# Patient Record
Sex: Female | Born: 1970 | Race: Black or African American | Hispanic: No | Marital: Married | Smoking: Never smoker
Health system: Southern US, Community
[De-identification: ages and names within clinical notes are randomized; demographics above are authoritative.]

## PROBLEM LIST (undated history)

## (undated) DIAGNOSIS — R011 Cardiac murmur, unspecified: Secondary | ICD-10-CM

## (undated) DIAGNOSIS — D649 Anemia, unspecified: Secondary | ICD-10-CM

## (undated) DIAGNOSIS — Z8489 Family history of other specified conditions: Secondary | ICD-10-CM

## (undated) DIAGNOSIS — N751 Abscess of Bartholin's gland: Secondary | ICD-10-CM

## (undated) DIAGNOSIS — N900 Mild vulvar dysplasia: Secondary | ICD-10-CM

## (undated) DIAGNOSIS — Z9889 Other specified postprocedural states: Secondary | ICD-10-CM

## (undated) DIAGNOSIS — B009 Herpesviral infection, unspecified: Secondary | ICD-10-CM

## (undated) DIAGNOSIS — A630 Anogenital (venereal) warts: Secondary | ICD-10-CM

## (undated) HISTORY — DX: Anogenital (venereal) warts: A63.0

## (undated) HISTORY — DX: Cardiac murmur, unspecified: R01.1

## (undated) HISTORY — PX: VULVA /PERINEUM BIOPSY: SHX319

## (undated) HISTORY — PX: EYE SURGERY: SHX253

## (undated) HISTORY — DX: Abscess of Bartholin's gland: N75.1

## (undated) HISTORY — DX: Herpesviral infection, unspecified: B00.9

## (undated) HISTORY — DX: Mild vulvar dysplasia: N90.0

## (undated) HISTORY — PX: WISDOM TOOTH EXTRACTION: SHX21

---

## 1998-11-20 ENCOUNTER — Inpatient Hospital Stay (HOSPITAL_COMMUNITY): Admission: AD | Admit: 1998-11-20 | Discharge: 1998-11-22 | Payer: Self-pay | Admitting: Obstetrics and Gynecology

## 1998-11-24 ENCOUNTER — Encounter (HOSPITAL_COMMUNITY): Admission: RE | Admit: 1998-11-24 | Discharge: 1999-02-22 | Payer: Self-pay | Admitting: Obstetrics and Gynecology

## 1999-06-29 ENCOUNTER — Other Ambulatory Visit: Admission: RE | Admit: 1999-06-29 | Discharge: 1999-06-29 | Payer: Self-pay | Admitting: Obstetrics and Gynecology

## 2000-06-01 ENCOUNTER — Emergency Department (HOSPITAL_COMMUNITY): Admission: EM | Admit: 2000-06-01 | Discharge: 2000-06-01 | Payer: Self-pay | Admitting: Emergency Medicine

## 2000-06-28 ENCOUNTER — Other Ambulatory Visit: Admission: RE | Admit: 2000-06-28 | Discharge: 2000-06-28 | Payer: Self-pay | Admitting: Obstetrics and Gynecology

## 2001-05-02 ENCOUNTER — Inpatient Hospital Stay (HOSPITAL_COMMUNITY): Admission: AD | Admit: 2001-05-02 | Discharge: 2001-05-04 | Payer: Self-pay | Admitting: Obstetrics and Gynecology

## 2001-07-26 ENCOUNTER — Other Ambulatory Visit: Admission: RE | Admit: 2001-07-26 | Discharge: 2001-07-26 | Payer: Self-pay | Admitting: Obstetrics and Gynecology

## 2002-07-15 ENCOUNTER — Other Ambulatory Visit: Admission: RE | Admit: 2002-07-15 | Discharge: 2002-07-15 | Payer: Self-pay | Admitting: Obstetrics and Gynecology

## 2003-07-25 ENCOUNTER — Other Ambulatory Visit: Admission: RE | Admit: 2003-07-25 | Discharge: 2003-07-25 | Payer: Self-pay | Admitting: Obstetrics and Gynecology

## 2004-07-29 ENCOUNTER — Other Ambulatory Visit: Admission: RE | Admit: 2004-07-29 | Discharge: 2004-07-29 | Payer: Self-pay | Admitting: Obstetrics and Gynecology

## 2005-07-25 ENCOUNTER — Other Ambulatory Visit: Admission: RE | Admit: 2005-07-25 | Discharge: 2005-07-25 | Payer: Self-pay | Admitting: Obstetrics and Gynecology

## 2006-07-26 ENCOUNTER — Other Ambulatory Visit: Admission: RE | Admit: 2006-07-26 | Discharge: 2006-07-26 | Payer: Self-pay | Admitting: Obstetrics and Gynecology

## 2009-07-13 ENCOUNTER — Encounter: Admission: RE | Admit: 2009-07-13 | Discharge: 2009-09-08 | Payer: Self-pay | Admitting: Nurse Practitioner

## 2009-08-20 ENCOUNTER — Ambulatory Visit (HOSPITAL_COMMUNITY): Admission: RE | Admit: 2009-08-20 | Discharge: 2009-08-20 | Payer: Self-pay | Admitting: Obstetrics and Gynecology

## 2010-08-24 ENCOUNTER — Ambulatory Visit (HOSPITAL_COMMUNITY): Admission: RE | Admit: 2010-08-24 | Discharge: 2010-08-24 | Payer: Self-pay | Admitting: Obstetrics and Gynecology

## 2011-03-04 NOTE — H&P (Signed)
[facility]  Patient:    CHACE, BISCH                      MRN: [id number] Adm. Date:  05/02/01 Attending:  Janine Limbo, M.D. Dictator:   Mack Guise, C.N.M.                         History and Physical  HISTORY:                      Ms. Culbertson is a gravida 3, para 1-0-1-1, at term, EDD May 02, 2001 as determined by LMP date and confirmed with pregnancy ultrasonography. She presents in advanced active labor, reports positive fetal movement, no bleeding, no rupture of membranes, denies any headache or visual changes or epigastric pain. 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 Her pregnancy was initially evaluated at the office of [facility] on September 13, 2000 at approximately [redacted] weeks gestation. Her pregnancy has been essentially unremarkable being size equal to dates throughout, normotensive with no proteinuria.  OBSTETRIC HISTORY:            1995, elective AB. In the year 2000, normal spontaneous vaginal delivery with the birth of a 7-pound 14-ounce female infant at term named Marvis Moeller with no complication, and the present pregnancy.  PAST MEDICAL HISTORY:         History of HSV, last outbreak 1998.  PAST SURGICAL HISTORY:        Wisdom teeth.  FAMILY HISTORY:               Maternal grandfather MI, maternal aunt MI, patients mother with a history of varicose veins, maternal uncle with diabetes, and patients father with diabetes. A maternal uncle has cancer of unknown origin.  GENETIC HISTORY:              There is no family history of familial or genetic disorders, children that died in infancy, or that were born with birth defects.  ALLERGIES:                    No known drug allergies.  HABITS:                       Patient denies the use of tobacco, alcohol, or illicit  drugs.  SOCIAL HISTORY:               Ms. Mcgath is a 40 year old African-American, married female. Her husband, Richetta Cubillos, is involved and supportive. They are of the [facility] faith.  REVIEW OF SYSTEMS:            There are no signs or symptoms suggestive of focal or systemic disease and this patient is typical of one with a uterine pregnancy at term in advanced active labor.  PHYSICAL EXAMINATION:  VITAL SIGNS:                  Stable, afebrile.  HEENT:                        Unremarkable.  HEART:  Regular rate and rhythm.  LUNGS:                        Clear.  ABDOMEN:                      Gravid in its contour. Uterine fundus is noted to extend 40 cm above the level of the pubis symphysis. Leopolds maneuvers finds the infant to be a longitudinal lie, cephalic presentation, and estimated fetal weight of 7-1/2 pounds.  CERVIX:                       Digital exam of the cervix finds it to be completely dilated, completely effaced with a vertex at a 0 station, membranes intact with rupture of membranes with exam for clear fluid. Sterile speculum exam finds no external HSV lesions and no internal HSV lesions noted.  ELECTRONIC FETAL MONITOR:     The baseline of the electronic fetal monitor is 130-140 with average long-term variability. It is reactive and reassuring.  EXTREMITIES:                  There is no pathologic edema and DTRs are 1+ with no clonus.  ASSESSMENT:                   1. Intrauterine pregnancy at term.                               2. Advanced active labor.  PLAN:                         Admit per Dr. Marline Backbone. Follow expectantly in anticipation of a spontaneous vaginal delivery. DD:  05/02/01 TD:  05/02/01 Job: [id number]

## 2011-07-19 ENCOUNTER — Other Ambulatory Visit (HOSPITAL_COMMUNITY): Payer: Self-pay | Admitting: Obstetrics and Gynecology

## 2011-07-19 DIAGNOSIS — Z1231 Encounter for screening mammogram for malignant neoplasm of breast: Secondary | ICD-10-CM

## 2011-08-31 ENCOUNTER — Ambulatory Visit (HOSPITAL_COMMUNITY): Payer: Self-pay

## 2011-09-29 ENCOUNTER — Ambulatory Visit (HOSPITAL_COMMUNITY): Payer: Self-pay

## 2011-11-02 ENCOUNTER — Ambulatory Visit (HOSPITAL_COMMUNITY)
Admission: RE | Admit: 2011-11-02 | Discharge: 2011-11-02 | Disposition: A | Discharge status: Home / Self Care | Payer: 59 | Source: Ambulatory Visit | Attending: Obstetrics and Gynecology | Admitting: Obstetrics and Gynecology

## 2011-11-02 DIAGNOSIS — Z1231 Encounter for screening mammogram for malignant neoplasm of breast: Secondary | ICD-10-CM

## 2012-08-21 ENCOUNTER — Other Ambulatory Visit: Payer: Self-pay | Admitting: Obstetrics and Gynecology

## 2012-08-21 DIAGNOSIS — Z1231 Encounter for screening mammogram for malignant neoplasm of breast: Secondary | ICD-10-CM

## 2012-09-24 ENCOUNTER — Ambulatory Visit: Payer: Self-pay | Admitting: Obstetrics and Gynecology

## 2012-10-30 ENCOUNTER — Encounter: Payer: Self-pay | Admitting: Obstetrics and Gynecology

## 2012-10-30 ENCOUNTER — Ambulatory Visit: Payer: 59 | Admitting: Obstetrics and Gynecology

## 2012-10-30 VITALS — BP 122/80 | Ht 67.5 in | Wt 181.0 lb

## 2012-10-30 DIAGNOSIS — Z01419 Encounter for gynecological examination (general) (routine) without abnormal findings: Secondary | ICD-10-CM

## 2012-10-30 DIAGNOSIS — Z124 Encounter for screening for malignant neoplasm of cervix: Secondary | ICD-10-CM

## 2012-10-30 NOTE — Progress Notes (Signed)
Subjective:    Kathryn Sandoval is a 42 y.o. female, G3P0, who presents for an annual exam. The patient reports heavy menses for the past six months but not every month. Flow amount is as described below.  Though, per patient her iron level was 11 she has not felt more tired or had any dizziness.     Menstrual cycle:   LMP: Patient's last menstrual period was 10/07/2012.  Flow 4 days with pad with tampon change every hour but some months not as heavy; denies any cramping or intermenstrual bleeding            Review of Systems Pertinent items are noted in HPI. Denies pelvic pain, urinary tract symptoms, vaginitis symptoms, irregular bleeding, menopausal symptoms, change in bowel habits or rectal bleeding   Objective:    Ht 5' 7.5" (1.715 m)  Wt 181 lb (82.101 kg)  BMI 27.93 kg/m2  LMP 10/07/2012     Wt Readings from Last 1 Encounters:  10/30/12 181 lb (82.101 kg)   Body mass index is 27.93 kg/(m^2). General Appearance: Alert, no acute distress HEENT: Grossly normal Neck / Thyroid: Supple, no thyromegaly or cervical adenopathy Lungs: Clear to auscultation bilaterally Back: No CVA tenderness Breast Exam: No masses or nodes.No dimpling, nipple retraction or discharge. Cardiovascular: Regular rate and rhythm.  Gastrointestinal: Soft, non-tender, no masses or organomegaly Pelvic Exam: EGBUS-wnl, vagina-normal rugae, cervix- without lesions or tenderness, uterus appears normal size shape and consistency, adnexae-no masses or tenderness Rectovaginal: no masses and normal sphincter tone Lymphatic Exam: Non-palpable nodes in neck, clavicular,  axillary, or inguinal regions  Skin: no rashes or abnormalities Extremities: no clubbing cyanosis or edema  Neurologic: grossly normal Psychiatric: Alert and oriented    Assessment:   Routine GYN Exam Menorrhagia   Plan:  Reviewed management options for menorrhagia-both medical and surgical -patient declines for now  PAP sent  RTO 1 year or  prn  Kathryn Sandoval,ELMIRAPA-C

## 2012-10-30 NOTE — Progress Notes (Signed)
Regular Periods: yes Mammogram: yes  Monthly Breast Ex.: yes Exercise: yes  Tetanus < 10 years: yes Seatbelts: yes  NI. Bladder Functn.: yes Abuse at home: no  Daily BM's: yes Stressful Work: yes  Healthy Diet: yes Sigmoid-Colonoscopy: no  Calcium: yes Medical problems this year: cycle are heavy   LAST PAP:2013  Contraception: vas  spouse  Mammogram:  2013 scheduled Friday   PCP: DR. LISA MILLER  PMH: NO CHANGE  FMH: NO CHANGE  Last Bone Scan: NO  PT IS MARRIED.

## 2012-11-01 LAB — PAP IG W/ RFLX HPV ASCU

## 2012-11-02 ENCOUNTER — Ambulatory Visit (HOSPITAL_COMMUNITY)
Admission: RE | Admit: 2012-11-02 | Discharge: 2012-11-02 | Disposition: A | Discharge status: Home / Self Care | Payer: 59 | Source: Ambulatory Visit | Attending: Obstetrics and Gynecology | Admitting: Obstetrics and Gynecology

## 2012-11-02 DIAGNOSIS — Z1231 Encounter for screening mammogram for malignant neoplasm of breast: Secondary | ICD-10-CM

## 2012-11-06 ENCOUNTER — Encounter: Payer: Self-pay | Admitting: Obstetrics and Gynecology

## 2012-11-28 ENCOUNTER — Telehealth: Payer: Self-pay | Admitting: Obstetrics and Gynecology

## 2012-11-28 MED ORDER — FLUCONAZOLE 150 MG PO TABS: 150.0000 mg | ORAL_TABLET | Freq: Once | ORAL | Status: DC

## 2012-11-28 NOTE — Telephone Encounter (Signed)
Tc from pt. Sates has creamy white D/C x 2 wks. Tried [place] zole but  Sx returned. Per EP, informed yeast on pap.TC to pt. Informed Rx will be called. Instructions given.

## 2013-02-19 ENCOUNTER — Other Ambulatory Visit: Payer: Self-pay | Admitting: Obstetrics and Gynecology

## 2013-02-22 ENCOUNTER — Encounter (HOSPITAL_COMMUNITY): Payer: Self-pay

## 2013-03-11 NOTE — H&P (Signed)
Pt is presenting for removal of prolapsed fibroid and endometrial ablation.  The pt presented with a long history  of menorrhagia.  She has had anemia as low as 7.0. Her menses is is every month and lasts 4 days.  She changes a pad and or tampon q hour on the first two days.  It has been heavy for a year  NKDA Scheduled Meds:iron silfate, multivitamin Continuous Infusions: PRN Meds:.   Past Medical History  Diagnosis Date  . Condyloma     H/O  . VIN I (vulvar intraepithelial neoplasia I)   . Bartholin's gland abscess     RIGHT  . HSV (herpes simplex virus) infection     LAST OUTBREAK 1998   Past Surgical History  Procedure Laterality Date  . Vulva /perineum biopsy    . Wisdom tooth extraction     History   Social History  . Marital Status: Married    Spouse Name: N/A    Number of Children: N/A  . Years of Education: N/A   Occupational History  . Not on file.   Social History Main Topics  . Smoking status: Never Smoker   . Smokeless tobacco: Never Used  . Alcohol Use: Yes  . Drug Use: No  . Sexually Active: Yes    Birth Control/ Protection: Surgical     Comment: spouse had vas   Other Topics Concern  . Not on file   Social History Narrative  . No narrative on file    Family History  Problem Relation Age of Onset  . Hypertension Mother   . Diabetes Father   . Hypertension Sister   . Hypertension Sister    Ht 5\' 7"  (1.702 m)  Wt 176 lb (79.833 kg)  BMI 27.56 kg/m2 Physical Examination: General appearance - alert, well appearing, and in no distress Mental status - alert, oriented to person, place, and time Chest - clear to auscultation, no wheezes, rales or rhonchi, symmetric air entry Heart - normal rate, regular rhythm, normal S1, S2, no murmurs, rubs, clicks or gallops Abdomen - soft, nontender, nondistended, no masses or organomegaly Pelvic - VULVA: normal appearing vulva with no masses, tenderness or lesions, VAGINA: normal appearing vagina with normal  color and discharge, no lesions, CERVIX: lesion present prolapsing fibroid about 4 cm through the cervix, UTERUS: uterus is normal size, shape, consistency and nontender, ADNEXA: normal adnexa in size, nontender and no masses, PAP: 1/14 WNL Neurological - alert, oriented, normal speech, no focal findings or movement disorder noted Extremities - Homan's sign negative bilaterally US 6by 6 cm with normal ovaries Bilaterally Menorrhagia Prolapsing fibroid Plan D&C Hysteroscopy and removal of prolapsing fibroid Pt understands the risks to be but not limited to bleeding, infection damage to the uterus and internal organs.  Pt voiced understanding of the benefits and risks and agrees to proceed.

## 2013-03-12 ENCOUNTER — Ambulatory Visit (HOSPITAL_COMMUNITY)
Admission: RE | Admit: 2013-03-12 | Discharge: 2013-03-12 | Disposition: A | Discharge status: Home / Self Care | Payer: 59 | Source: Ambulatory Visit | Attending: Obstetrics and Gynecology | Admitting: Obstetrics and Gynecology

## 2013-03-12 ENCOUNTER — Encounter (HOSPITAL_COMMUNITY)
Admission: RE | Disposition: A | Discharge status: Home / Self Care | Payer: Self-pay | Source: Ambulatory Visit | Attending: Obstetrics and Gynecology

## 2013-03-12 ENCOUNTER — Ambulatory Visit (HOSPITAL_COMMUNITY): Payer: 59 | Admitting: Anesthesiology

## 2013-03-12 ENCOUNTER — Encounter (HOSPITAL_COMMUNITY): Payer: Self-pay | Admitting: Anesthesiology

## 2013-03-12 ENCOUNTER — Encounter (HOSPITAL_COMMUNITY): Payer: Self-pay | Admitting: *Deleted

## 2013-03-12 DIAGNOSIS — D259 Leiomyoma of uterus, unspecified: Secondary | ICD-10-CM | POA: Insufficient documentation

## 2013-03-12 DIAGNOSIS — N920 Excessive and frequent menstruation with regular cycle: Secondary | ICD-10-CM | POA: Insufficient documentation

## 2013-03-12 DIAGNOSIS — N841 Polyp of cervix uteri: Secondary | ICD-10-CM | POA: Insufficient documentation

## 2013-03-12 HISTORY — PX: DILITATION & CURRETTAGE/HYSTROSCOPY WITH THERMACHOICE ABLATION: SHX5569

## 2013-03-12 HISTORY — DX: Anemia, unspecified: D64.9

## 2013-03-12 LAB — CBC
HCT: 42.3 % (ref 36.0–46.0)
Hemoglobin: 13.6 g/dL (ref 12.0–15.0)
MCHC: 32.2 g/dL (ref 30.0–36.0)

## 2013-03-12 SURGERY — DILATATION & CURETTAGE/HYSTEROSCOPY WITH THERMACHOICE ABLATION
Anesthesia: General | Site: Vagina | Wound class: Clean Contaminated

## 2013-03-12 MED ORDER — KETOROLAC TROMETHAMINE 30 MG/ML IJ SOLN
INTRAMUSCULAR | Status: DC | PRN
  Administered 2013-03-12: 30 mg via INTRAVENOUS

## 2013-03-12 MED ORDER — PROPOFOL 10 MG/ML IV EMUL
INTRAVENOUS | Status: AC
Start: 2013-03-12 — End: 2013-03-12
  Filled 2013-03-12: qty 20

## 2013-03-12 MED ORDER — DEXTROSE 5 % IV SOLN
INTRAVENOUS | Status: DC | PRN
  Administered 2013-03-12: 1000 mL

## 2013-03-12 MED ORDER — HYDROCODONE-ACETAMINOPHEN 5-325 MG PO TABS: 1.0000 | ORAL_TABLET | Freq: Four times a day (QID) | ORAL | Status: DC | PRN

## 2013-03-12 MED ORDER — LACTATED RINGERS IV SOLN
INTRAVENOUS | Status: DC
  Administered 2013-03-12: 13:00:00 via INTRAVENOUS
  Administered 2013-03-12: 50 mL/h via INTRAVENOUS

## 2013-03-12 MED ORDER — LIDOCAINE HCL 2 % IJ SOLN
INTRAMUSCULAR | Status: DC | PRN
  Administered 2013-03-12: 20 mL

## 2013-03-12 MED ORDER — DEXAMETHASONE SODIUM PHOSPHATE 4 MG/ML IJ SOLN
INTRAMUSCULAR | Status: DC | PRN
  Administered 2013-03-12: 10 mg via INTRAVENOUS

## 2013-03-12 MED ORDER — FENTANYL CITRATE 0.05 MG/ML IJ SOLN
INTRAMUSCULAR | Status: DC | PRN
  Administered 2013-03-12 (×4): 50 ug via INTRAVENOUS

## 2013-03-12 MED ORDER — LIDOCAINE HCL (CARDIAC) 20 MG/ML IV SOLN
INTRAVENOUS | Status: AC
  Filled 2013-03-12: qty 5

## 2013-03-12 MED ORDER — MIDAZOLAM HCL 2 MG/2ML IJ SOLN
INTRAMUSCULAR | Status: AC
  Filled 2013-03-12: qty 2

## 2013-03-12 MED ORDER — PROPOFOL 10 MG/ML IV BOLUS
INTRAVENOUS | Status: DC | PRN
  Administered 2013-03-12: 180 mg via INTRAVENOUS

## 2013-03-12 MED ORDER — KETOROLAC TROMETHAMINE 30 MG/ML IJ SOLN
INTRAMUSCULAR | Status: AC
  Filled 2013-03-12: qty 1

## 2013-03-12 MED ORDER — LIDOCAINE HCL 2 % IJ SOLN
INTRAMUSCULAR | Status: AC
  Filled 2013-03-12: qty 20

## 2013-03-12 MED ORDER — MIDAZOLAM HCL 5 MG/5ML IJ SOLN
INTRAMUSCULAR | Status: DC | PRN
  Administered 2013-03-12: 2 mg via INTRAVENOUS

## 2013-03-12 MED ORDER — LIDOCAINE HCL (CARDIAC) 20 MG/ML IV SOLN
INTRAVENOUS | Status: DC | PRN
  Administered 2013-03-12: 40 mg via INTRAVENOUS
  Administered 2013-03-12: 30 mg via INTRAVENOUS

## 2013-03-12 MED ORDER — FENTANYL CITRATE 0.05 MG/ML IJ SOLN
INTRAMUSCULAR | Status: AC
  Filled 2013-03-12: qty 4

## 2013-03-12 MED ORDER — ONDANSETRON HCL 4 MG/2ML IJ SOLN
INTRAMUSCULAR | Status: DC | PRN
  Administered 2013-03-12: 4 mg via INTRAVENOUS

## 2013-03-12 MED ORDER — DEXAMETHASONE SODIUM PHOSPHATE 10 MG/ML IJ SOLN
INTRAMUSCULAR | Status: AC
  Filled 2013-03-12: qty 1

## 2013-03-12 MED ORDER — ONDANSETRON HCL 4 MG/2ML IJ SOLN
INTRAMUSCULAR | Status: AC
  Filled 2013-03-12: qty 2

## 2013-03-12 MED ORDER — HYDROCODONE-ACETAMINOPHEN 5-325 MG PO TABS
ORAL_TABLET | ORAL | Status: AC
  Filled 2013-03-12: qty 1

## 2013-03-12 SURGICAL SUPPLY — 14 items
CANISTER SUCTION 2500CC (MISCELLANEOUS) ×2 IMPLANT
CATH ROBINSON RED A/P 16FR (CATHETERS) ×2 IMPLANT
CATH THERMACHOICE III (CATHETERS) ×2 IMPLANT
CLOTH BEACON ORANGE TIMEOUT ST (SAFETY) ×2 IMPLANT
CONTAINER PREFILL 10% NBF 60ML (FORM) ×4 IMPLANT
DRESSING TELFA 8X3 (GAUZE/BANDAGES/DRESSINGS) ×2 IMPLANT
GLOVE BIO SURGEON STRL SZ 6.5 (GLOVE) ×2 IMPLANT
GOWN STRL REIN XL XLG (GOWN DISPOSABLE) ×4 IMPLANT
PACK HYSTEROSCOPY LF (CUSTOM PROCEDURE TRAY) ×2 IMPLANT
PAD OB MATERNITY 4.3X12.25 (PERSONAL CARE ITEMS) ×2 IMPLANT
PENCIL BUTTON HOLSTER BLD 10FT (ELECTRODE) ×2 IMPLANT
SYR 20CC LL (SYRINGE) ×2 IMPLANT
TOWEL OR 17X24 6PK STRL BLUE (TOWEL DISPOSABLE) ×4 IMPLANT
WATER STERILE IRR 1000ML POUR (IV SOLUTION) ×2 IMPLANT

## 2013-03-12 NOTE — Anesthesia Preprocedure Evaluation (Signed)

## 2013-03-12 NOTE — Op Note (Signed)
Preop Diagnosis: Prolapsing Fibroid, Menorrhagia CPT - 58563/58561   Postop Diagnosis: prolapsing fibroid, menorrhagia   Procedure: DILATATION & CURETTAGE/HYSTEROSCOPY WITH THERMACHOICE ABLATION  Removal of prolapsing endocervical mass  Anesthesia: General   Anesthesiologist: No responsible provider has been recorded for the case.   Attending: Michael Litter, MD   Assistant: none  Findings: prolapsing cervical mass c/w a fibroid.  Normal appearing endometrial cavity  Pathology: mass and endometrial curretings  Fluids:   UOP:  EBL: minimal  Complications: none  Procedure: The patient was taken to the operating room after risks benefits and alternatives were discussed with patient, the patient verbalized understanding and consent signed and witnessed. The patient was placed under general anesthesia and prepped and draped in normal sterile fashion. A bivalve speculum was placed in the patient's vagina and the anterior lip of the cervix was grasped with a single-tooth tenaculum. The mass protruding through the cervix was grasped with a polyp forcep and removed.  A small remainder of the stump in the endocervical canal was removed and cauterized wit the bovie.   The uterus sounded to 8 cm.  The hysteroscope was introduced into the uterine cavity with findings as noted above. A curettage was performed and currettings sent to pathology. The hysteroscope was reintroduced and hydrothermal ablation was performed without difficulty. The tenaculum and bivalve speculum were removed and there was good hemostasis at the tenaculum sites. Sponge lap and needle count was correct. The patient tolerated procedure well and was returned to the recovery room in good condition.

## 2013-03-12 NOTE — Transfer of Care (Signed)
Immediate Anesthesia Transfer of Care Note  Patient: Kathryn Sandoval  Procedure(s) Performed: Procedure(s) with comments: DILATATION & CURETTAGE/HYSTEROSCOPY WITH THERMACHOICE ABLATION (N/A) - D&C with Removal of Prolapsing Fibroid, Thermachoice EM Ablation  Patient Location: PACU  Anesthesia Type:General  Level of Consciousness: awake, alert  and oriented  Airway & Oxygen Therapy: Patient Spontanous Breathing and Patient connected to nasal cannula oxygen  Post-op Assessment: Report given to PACU RN and Post -op Vital signs reviewed and stable  Post vital signs: Reviewed and stable  Complications: No apparent anesthesia complications

## 2013-03-12 NOTE — Anesthesia Postprocedure Evaluation (Signed)
  Anesthesia Post-op Note  Anesthesia Post Note  Patient: Kathryn Sandoval  Procedure(s) Performed: Procedure(s) (LRB): DILATATION & CURETTAGE/HYSTEROSCOPY WITH THERMACHOICE ABLATION (N/A)  Anesthesia type: General  Patient location: PACU  Post pain: Pain level controlled  Post assessment: Post-op Vital signs reviewed  Last Vitals:  Filed Vitals:   03/12/13 1345  BP: 140/76  Pulse: 65  Temp:   Resp: 16    Post vital signs: Reviewed  Level of consciousness: sedated  Complications: No apparent anesthesia complications

## 2013-03-13 ENCOUNTER — Encounter (HOSPITAL_COMMUNITY): Payer: Self-pay | Admitting: Obstetrics and Gynecology

## 2013-06-09 ENCOUNTER — Ambulatory Visit (INDEPENDENT_AMBULATORY_CARE_PROVIDER_SITE_OTHER): Payer: 59 | Admitting: Emergency Medicine

## 2013-06-09 VITALS — BP 102/67 | HR 78 | Temp 98.3°F | Resp 18 | Wt 178.0 lb

## 2013-06-09 DIAGNOSIS — H8110 Benign paroxysmal vertigo, unspecified ear: Secondary | ICD-10-CM

## 2013-06-09 MED ORDER — MECLIZINE HCL 50 MG PO TABS: 50.0000 mg | ORAL_TABLET | Freq: Three times a day (TID) | ORAL | Status: DC | PRN

## 2013-06-09 NOTE — Patient Instructions (Addendum)

## 2013-06-09 NOTE — Progress Notes (Signed)
[facility] [address] [place] [address] [phone]  Date:  06/09/2013   Name:  Kathryn Sandoval   DOB:  13-Mar-1971   MRN:  [id number]  PCP:  Neldon Labella, MD    Chief Complaint: off balance   History of Present Illness:  Kathryn Sandoval is a 42 y.o. very pleasant female patient who presents with the following:  Ill with 'imbalance' and a wet feeling in both of her ears past few days. History of vertigo never treated.  No cough or coryza, fever or chills, nausea or vomiting. No ear pain or drainage.  No improvement with over the counter medications or other home remedies. Denies other complaint or health concern today.   There are no active problems to display for this patient.   Past Medical History  Diagnosis Date  . Condyloma     H/O  . VIN I (vulvar intraepithelial neoplasia I)   . Bartholin's gland abscess     RIGHT  . HSV (herpes simplex virus) infection     LAST OUTBREAK 1998  . Anemia   . Heart murmur     Past Surgical History  Procedure Laterality Date  . Vulva /perineum biopsy    . Wisdom tooth extraction    . Dilitation & currettage/hystroscopy with thermachoice ablation N/A 03/12/2013    Procedure: DILATATION & CURETTAGE/HYSTEROSCOPY WITH THERMACHOICE ABLATION;  Surgeon: Michael Litter, MD;  Location: [facility];  Service: Gynecology;  Laterality: N/A;  D&C with Removal of Prolapsing Fibroid, Thermachoice EM Ablation  . Eye surgery      History  Substance Use Topics  . Smoking status: Never Smoker   . Smokeless tobacco: Never Used  . Alcohol Use: Yes    Family History  Problem Relation Age of Onset  . Hypertension Mother   . Diabetes Father   . Hypertension Sister   . Hypertension Sister     No Known Allergies  Medication list has been reviewed and updated.  Current Outpatient Prescriptions on File Prior to Visit  Medication Sig Dispense Refill  . Ferrous Sulfate (IRON) 28 MG TABS Take by mouth.      .  fluocinonide ointment (LIDEX) 0.05 % Apply 1 application topically 3 (three) times a week. For dry scalp      . Multiple Vitamin (MULTIVITAMIN) tablet Take 1 tablet by mouth daily.      Marland Kitchen HYDROcodone-acetaminophen (NORCO/VICODIN) 5-325 MG per tablet Take 1 tablet by mouth every 6 (six) hours as needed for pain.  30 tablet  0   No current facility-administered medications on file prior to visit.    Review of Systems:  As per HPI, otherwise negative.    Physical Examination: Filed Vitals:   06/09/13 1417  BP: 102/67  Pulse: 78  Temp: 98.3 F (36.8 C)  Resp: 18   Filed Vitals:   06/09/13 1417  Weight: 178 lb (80.74 kg)   Body mass index is 27.87 kg/(m^2). Ideal Body Weight:    GEN: WDWN, NAD, Non-toxic, A & O x 3 HEENT: Atraumatic, Normocephalic. Neck supple. No masses, No LAD. Ears and Nose: No external deformity.  TM and external canal normal CV: RRR, No M/G/R. No JVD. No thrill. No extra heart sounds. PULM: CTA B, no wheezes, crackles, rhonchi. No retractions. No resp. distress. No accessory muscle use. ABD: S, NT, ND, +BS. No rebound. No HSM. EXTR: No c/c/e NEURO Normal gait. Tandem gait normal. PRRERLA EOMI CN 2-12 intact PSYCH: Normally interactive.  Conversant. Not depressed or anxious appearing.  Calm demeanor.    Assessment and Plan: Benign positional vertigo antivert    Signed,  Phillips Odor, MD

## 2013-09-16 ENCOUNTER — Other Ambulatory Visit: Payer: Self-pay | Admitting: Obstetrics and Gynecology

## 2013-09-16 DIAGNOSIS — Z1231 Encounter for screening mammogram for malignant neoplasm of breast: Secondary | ICD-10-CM

## 2013-11-04 ENCOUNTER — Ambulatory Visit (HOSPITAL_COMMUNITY)
Admission: RE | Admit: 2013-11-04 | Discharge: 2013-11-04 | Disposition: A | Discharge status: Home / Self Care | Payer: 59 | Source: Ambulatory Visit | Attending: Obstetrics and Gynecology | Admitting: Obstetrics and Gynecology

## 2013-11-04 DIAGNOSIS — Z1231 Encounter for screening mammogram for malignant neoplasm of breast: Secondary | ICD-10-CM

## 2014-09-29 ENCOUNTER — Other Ambulatory Visit (HOSPITAL_COMMUNITY): Payer: Self-pay | Admitting: Obstetrics and Gynecology

## 2014-09-29 DIAGNOSIS — Z1231 Encounter for screening mammogram for malignant neoplasm of breast: Secondary | ICD-10-CM

## 2014-11-06 ENCOUNTER — Ambulatory Visit (HOSPITAL_COMMUNITY)
Admission: RE | Admit: 2014-11-06 | Discharge: 2014-11-06 | Disposition: A | Discharge status: Home / Self Care | Payer: 59 | Source: Ambulatory Visit | Attending: Obstetrics and Gynecology | Admitting: Obstetrics and Gynecology

## 2014-11-06 DIAGNOSIS — Z1231 Encounter for screening mammogram for malignant neoplasm of breast: Secondary | ICD-10-CM | POA: Diagnosis present

## 2015-10-15 ENCOUNTER — Other Ambulatory Visit: Payer: Self-pay

## 2015-10-15 DIAGNOSIS — Z1231 Encounter for screening mammogram for malignant neoplasm of breast: Secondary | ICD-10-CM

## 2015-11-10 ENCOUNTER — Ambulatory Visit
Admission: RE | Admit: 2015-11-10 | Discharge: 2015-11-10 | Disposition: A | Discharge status: Home / Self Care | Payer: 59 | Source: Ambulatory Visit

## 2015-11-10 DIAGNOSIS — Z1231 Encounter for screening mammogram for malignant neoplasm of breast: Secondary | ICD-10-CM

## 2016-10-03 ENCOUNTER — Other Ambulatory Visit: Payer: Self-pay | Admitting: Obstetrics and Gynecology

## 2016-10-03 DIAGNOSIS — Z1231 Encounter for screening mammogram for malignant neoplasm of breast: Secondary | ICD-10-CM

## 2016-10-18 DIAGNOSIS — Z0000 Encounter for general adult medical examination without abnormal findings: Secondary | ICD-10-CM | POA: Diagnosis not present

## 2016-10-18 DIAGNOSIS — Z862 Personal history of diseases of the blood and blood-forming organs and certain disorders involving the immune mechanism: Secondary | ICD-10-CM | POA: Diagnosis not present

## 2016-10-24 DIAGNOSIS — Z0000 Encounter for general adult medical examination without abnormal findings: Secondary | ICD-10-CM | POA: Diagnosis not present

## 2016-11-10 ENCOUNTER — Ambulatory Visit
Admission: RE | Admit: 2016-11-10 | Discharge: 2016-11-10 | Disposition: A | Discharge status: Home / Self Care | Payer: 59 | Source: Ambulatory Visit | Attending: Obstetrics and Gynecology | Admitting: Obstetrics and Gynecology

## 2016-11-10 DIAGNOSIS — Z1231 Encounter for screening mammogram for malignant neoplasm of breast: Secondary | ICD-10-CM

## 2016-12-01 DIAGNOSIS — L219 Seborrheic dermatitis, unspecified: Secondary | ICD-10-CM | POA: Diagnosis not present

## 2016-12-12 ENCOUNTER — Ambulatory Visit (INDEPENDENT_AMBULATORY_CARE_PROVIDER_SITE_OTHER): Payer: Self-pay | Admitting: Allergy

## 2016-12-12 ENCOUNTER — Encounter (INDEPENDENT_AMBULATORY_CARE_PROVIDER_SITE_OTHER): Payer: Self-pay

## 2016-12-12 ENCOUNTER — Encounter: Payer: Self-pay | Admitting: Allergy

## 2016-12-12 VITALS — BP 112/68 | HR 68 | Temp 98.7°F | Resp 16 | Ht 66.5 in | Wt 180.6 lb

## 2016-12-12 DIAGNOSIS — J45990 Exercise induced bronchospasm: Secondary | ICD-10-CM

## 2016-12-12 DIAGNOSIS — R05 Cough: Secondary | ICD-10-CM | POA: Diagnosis not present

## 2016-12-12 DIAGNOSIS — R059 Cough, unspecified: Secondary | ICD-10-CM

## 2016-12-12 DIAGNOSIS — J302 Other seasonal allergic rhinitis: Secondary | ICD-10-CM | POA: Diagnosis not present

## 2016-12-12 MED ORDER — LEVOCETIRIZINE DIHYDROCHLORIDE 5 MG PO TABS: 5.0000 mg | ORAL_TABLET | Freq: Every evening | ORAL | 5 refills | Status: DC

## 2016-12-12 MED ORDER — ALBUTEROL SULFATE HFA 108 (90 BASE) MCG/ACT IN AERS: 2.0000 | INHALATION_SPRAY | RESPIRATORY_TRACT | 1 refills | Status: AC | PRN

## 2016-12-12 MED ORDER — AZELASTINE HCL 0.1 % NA SOLN
2.0000 | Freq: Two times a day (BID) | NASAL | 3 refills | Status: AC
Start: 2016-12-12 — End: ?

## 2016-12-12 NOTE — Patient Instructions (Addendum)
Seasonal Cough    - allergy testing today was mildly positive for seasonal mold (alternaria and cladosporium)    - breathing test was normal    - start use of Astelin nasal spray 2 sprays each nostril twice a day    - may take antihistamine (ie. xyzal 5mg  daily) along with nasal spray    - let us know in about 2-4 weeks how nasal spray is working    - mold avoidance discussed  Follow-up 6 months

## 2016-12-12 NOTE — Progress Notes (Signed)
New Patient Note  RE: Kathryn Sandoval MRN: [id number] DOB: 12-Jan-1971 Date of Office Visit: 12/12/2016  Referring provider: Marda Stalker, PA-C Primary care provider: Tawanna Solo, MD  Chief Complaint: dry cough  History of present illness: Kathryn Sandoval is a 46 y.o. female presenting today for consultation for cough.    She has been having an ongoing dry cough which she reports starts in the fall and continues thru early March.  This is the 4th year she has had this cough.   Cough is constant throughout the day.  She denies any significant nasal or ocular symptoms with the cough.  She does have a history of vertigo and reports she does get a "wet sensation" in her ear with vertigo but this occurs during other times of the year when she does not have the cough.  She has tried OTC medications mostly cough syrups, tussionex as well as tessalon perles. She also reports trying zyrtec the first year of the cough.  These medications do not seem to help her cough that much.   She reports she runs which seems to aggravate the cough and does have shortness of breath with running.  She has noted wheezing sometimes with running.  No chest tightness.  She was prescribed an albuterol inhaler last year.  She has only used it a couple times when the cough has been "bad" with minimal relief.      She does not have heartburn symptoms.   She has no history of eczema or food allergy.   Review of systems: Review of Systems  Constitutional: Negative for chills, fever and malaise/fatigue.  HENT: Negative for congestion, ear pain, nosebleeds, sinus pain and sore throat.   Eyes: Negative for discharge and redness.  Respiratory: Positive for cough, shortness of breath and wheezing. Negative for sputum production.   Cardiovascular: Negative for chest pain.  Gastrointestinal: Negative for abdominal pain and heartburn.  Musculoskeletal: Negative for joint pain and myalgias.  Skin: Negative for itching and  rash.  Neurological: Positive for dizziness. Negative for headaches.    All other systems negative unless noted above in HPI  Past medical history: Past Medical History:  Diagnosis Date  . Anemia   . Bartholin's gland abscess    RIGHT  . Condyloma    H/O  . Heart murmur   . 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 VIN I (vulvar intraepithelial neoplasia I)     Past surgical history: Past Surgical History:  Procedure Laterality Date  . DILITATION & CURRETTAGE/HYSTROSCOPY WITH THERMACHOICE ABLATION N/A 03/12/2013   Procedure: DILATATION & CURETTAGE/HYSTEROSCOPY WITH THERMACHOICE ABLATION;  Surgeon: Betsy Coder, MD;  Location: [facility];  Service: Gynecology;  Laterality: N/A;  D&C with Removal of Prolapsing Fibroid, Thermachoice EM Ablation  . EYE SURGERY    . VULVA /PERINEUM BIOPSY    . WISDOM TOOTH EXTRACTION      Family history:  Family History  Problem Relation Age of Onset  . Hypertension Mother   . Diabetes Father   . Hypertension Sister   . Hypertension Sister   . Allergic rhinitis Neg Hx   . Angioedema Neg Hx   . Asthma Neg Hx   . Eczema Neg Hx   . Immunodeficiency Neg Hx   . Urticaria Neg Hx     Social history: She lives in a home without carpeting with electric heating and central cooling. There is a dog in the home. There is  no concern for water damage or mildew or roaches in the home. She is a Education officer, museum.   Medication List: Allergies as of 12/12/2016   No Known Allergies     Medication List       Accurate as of 12/12/16 10:20 AM. Always use your most recent med list.          fluocinonide ointment 0.05 % Commonly known as:  LIDEX   meclizine 25 MG tablet Commonly known as:  ANTIVERT Take 25 mg by mouth 3 (three) times daily as needed for dizziness.   multivitamin tablet Take 1 tablet by mouth daily.       Known medication allergies: No Known Allergies   Physical examination: Blood pressure 112/68, pulse  68, temperature 98.7 F (37.1 C), temperature source Oral, resp. rate 16, height 5' 6.5" (1.689 m), weight 180 lb 9.6 oz (81.9 kg).  General: Alert, interactive, in no acute distress. HEENT: TMs pearly gray, turbinates minimally edematous without discharge, post-pharynx non erythematous. Neck: Supple without lymphadenopathy. Lungs: Clear to auscultation without wheezing, rhonchi or rales. {no increased work of breathing. CV: Normal S1, S2 without murmurs. Abdomen: Nondistended, nontender. Skin: Warm and dry, without lesions or rashes. Extremities:  No clubbing, cyanosis or edema. Neuro:   Grossly intact.  Diagnositics/Labs:  Spirometry: FEV1: 2.61L  106%, FVC: 2.96L  99%, ratio consistent with non-obstructive pattern  Allergy testing: environmental skin prick testing was negative. Intradermal testing was mildly positive for major mold mix 1 Allergy testing results were read and interpreted by provider, documented by clinical staff.   Assessment and plan:   Seasonal Cough, presumed allergic trigger    - allergy testing today was mildly positive for seasonal mold (alternaria and cladosporium).  She may have component of postnasal drainage with irritation of vocal cords leading to a dry cough.    - Spirometry is normal today    - start use of Astelin nasal spray 2 sprays each nostril twice a day    - may take antihistamine (ie. xyzal 5mg  daily) along with nasal spray    - let us know in about 2-4 weeks how nasal spray is working    - mold avoidance discussed  Exercise-induced bronchospasm    -  worsening cough with shortness of breath and wheezing with activity    - Advised that she have access to an albuterol inhaler. Recommend she use 2 puffs prior to activity and as needed use every 4-6 hours for cough, wheeze, shortness of breath or difficulty breathing   Follow-up 6 months   I appreciate the opportunity to take part in Indiah's care. Please do not hesitate to contact me with  questions.  Sincerely,   Prudy Feeler, MD Allergy/Immunology [facility]

## 2017-02-10 DIAGNOSIS — Z01419 Encounter for gynecological examination (general) (routine) without abnormal findings: Secondary | ICD-10-CM | POA: Diagnosis not present

## 2017-02-10 DIAGNOSIS — Z124 Encounter for screening for malignant neoplasm of cervix: Secondary | ICD-10-CM | POA: Diagnosis not present

## 2017-08-02 DIAGNOSIS — R05 Cough: Secondary | ICD-10-CM | POA: Diagnosis not present

## 2017-10-23 ENCOUNTER — Other Ambulatory Visit: Payer: Self-pay | Admitting: Obstetrics and Gynecology

## 2017-10-23 DIAGNOSIS — Z139 Encounter for screening, unspecified: Secondary | ICD-10-CM

## 2017-10-27 DIAGNOSIS — Z0000 Encounter for general adult medical examination without abnormal findings: Secondary | ICD-10-CM | POA: Diagnosis not present

## 2017-10-27 DIAGNOSIS — Z13220 Encounter for screening for lipoid disorders: Secondary | ICD-10-CM | POA: Diagnosis not present

## 2017-10-27 DIAGNOSIS — Z23 Encounter for immunization: Secondary | ICD-10-CM | POA: Diagnosis not present

## 2017-11-13 ENCOUNTER — Ambulatory Visit: Payer: 59

## 2017-11-30 ENCOUNTER — Ambulatory Visit
Admission: RE | Admit: 2017-11-30 | Discharge: 2017-11-30 | Disposition: A | Discharge status: Home / Self Care | Payer: 59 | Source: Ambulatory Visit | Attending: Obstetrics and Gynecology | Admitting: Obstetrics and Gynecology

## 2017-11-30 DIAGNOSIS — Z1231 Encounter for screening mammogram for malignant neoplasm of breast: Secondary | ICD-10-CM | POA: Diagnosis not present

## 2017-11-30 DIAGNOSIS — Z139 Encounter for screening, unspecified: Secondary | ICD-10-CM

## 2018-02-12 DIAGNOSIS — Z01419 Encounter for gynecological examination (general) (routine) without abnormal findings: Secondary | ICD-10-CM | POA: Diagnosis not present

## 2018-02-12 DIAGNOSIS — Z124 Encounter for screening for malignant neoplasm of cervix: Secondary | ICD-10-CM | POA: Diagnosis not present

## 2018-04-05 DIAGNOSIS — H00016 Hordeolum externum left eye, unspecified eyelid: Secondary | ICD-10-CM | POA: Diagnosis not present

## 2018-04-05 DIAGNOSIS — H0014 Chalazion left upper eyelid: Secondary | ICD-10-CM | POA: Diagnosis not present

## 2018-06-28 DIAGNOSIS — M25561 Pain in right knee: Secondary | ICD-10-CM | POA: Diagnosis not present

## 2018-06-28 DIAGNOSIS — R05 Cough: Secondary | ICD-10-CM | POA: Diagnosis not present

## 2018-08-15 ENCOUNTER — Other Ambulatory Visit: Payer: Self-pay

## 2018-08-15 ENCOUNTER — Ambulatory Visit (HOSPITAL_COMMUNITY)
Admission: EM | Admit: 2018-08-15 | Discharge: 2018-08-15 | Disposition: A | Discharge status: Home / Self Care | Payer: 59 | Attending: Family Medicine | Admitting: Family Medicine

## 2018-08-15 ENCOUNTER — Encounter (HOSPITAL_COMMUNITY): Payer: Self-pay

## 2018-08-15 DIAGNOSIS — N3001 Acute cystitis with hematuria: Secondary | ICD-10-CM | POA: Insufficient documentation

## 2018-08-15 DIAGNOSIS — R350 Frequency of micturition: Secondary | ICD-10-CM | POA: Diagnosis not present

## 2018-08-15 DIAGNOSIS — R809 Proteinuria, unspecified: Secondary | ICD-10-CM

## 2018-08-15 LAB — POCT URINALYSIS DIP (DEVICE)
Glucose, UA: NEGATIVE mg/dL
Ketones, ur: 15 mg/dL — AB
Nitrite: POSITIVE — AB
SPECIFIC GRAVITY, URINE: 1.025 (ref 1.005–1.030)
Urobilinogen, UA: 1 mg/dL (ref 0.0–1.0)
pH: 5.5 (ref 5.0–8.0)

## 2018-08-15 LAB — POCT PREGNANCY, URINE: Preg Test, Ur: NEGATIVE

## 2018-08-15 MED ORDER — CEPHALEXIN 500 MG PO CAPS: 500.0000 mg | ORAL_CAPSULE | Freq: Two times a day (BID) | ORAL | 0 refills | Status: AC

## 2018-08-15 MED ORDER — PHENAZOPYRIDINE HCL 200 MG PO TABS: 200.0000 mg | ORAL_TABLET | Freq: Three times a day (TID) | ORAL | 0 refills | Status: DC

## 2018-08-15 NOTE — Discharge Instructions (Signed)
Urine culture sent.  We will call you with the results.   Push fluids and get plenty of rest.   Take antibiotic as directed and to completion Take pyridium as prescribed and as needed for symptomatic relief Follow up with PCP for reevaluation and further evaluation and management of proteinuria within the next week Return here or go to ER if you have any new or worsening symptoms such as fever, worsening abdominal pain, nausea/vomiting, flank pain, etc..Marland Kitchen

## 2018-08-15 NOTE — ED Provider Notes (Signed)
[facility]   CC: UTI symptoms   SUBJECTIVE:  Kathryn Sandoval is a 47 y.o. female who complains of urinary frequency, urgency and dysuria that began 1 day ago.  Patient denies a precipitating event, recent sexual encounter, excessive caffeine intake.  Has NOT tried OTC medications without relief.  Symptoms are made worse with urination.  Denies similar symptoms in the past. Complains of hematuria  Denies fever, chills, nausea, vomiting, chest pain, SOB, abdominal pain, flank pain, abnormal vaginal discharge or bleeding, swelling in extremities.  LMP: Patient's last menstrual period was 07/21/2018.  ROS: As in HPI.  Past Medical History:  Diagnosis Date  . Anemia   . Bartholin's gland abscess    RIGHT  . Condyloma    H/O  . Heart murmur   . 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 VIN I (vulvar intraepithelial neoplasia I)    Past Surgical History:  Procedure Laterality Date  . DILITATION & CURRETTAGE/HYSTROSCOPY WITH THERMACHOICE ABLATION N/A 03/12/2013   Procedure: DILATATION & CURETTAGE/HYSTEROSCOPY WITH THERMACHOICE ABLATION;  Surgeon: Betsy Coder, MD;  Location: [facility];  Service: Gynecology;  Laterality: N/A;  D&C with Removal of Prolapsing Fibroid, Thermachoice EM Ablation  . EYE SURGERY    . VULVA /PERINEUM BIOPSY    . WISDOM TOOTH EXTRACTION     No Known Allergies No current facility-administered medications on file prior to encounter.    Current Outpatient Medications on File Prior to Encounter  Medication Sig Dispense Refill  . albuterol (VENTOLIN HFA) 108 (90 Base) MCG/ACT inhaler Inhale 2 puffs into the lungs every 4 (four) hours as needed for wheezing or shortness of breath. Use 15-20 minutes prior to activity. 1 Inhaler 1  . azelastine (ASTELIN) 0.1 % nasal spray Place 2 sprays into both nostrils 2 (two) times daily. Use in each nostril as directed 30 mL 3  . fluocinonide ointment (LIDEX) 0.05 %     . levocetirizine (XYZAL) 5 MG  tablet Take 1 tablet (5 mg total) by mouth every evening. 30 tablet 5  . meclizine (ANTIVERT) 25 MG tablet Take 25 mg by mouth 3 (three) times daily as needed for dizziness.    . Multiple Vitamin (MULTIVITAMIN) tablet Take 1 tablet by mouth daily.     Social History   Socioeconomic History  . Marital status: Married    Spouse name: Not on file  . Number of children: Not on file  . Years of education: Not on file  . Highest education level: Not on file  Occupational History  . Not on file  Social Needs  . Financial resource strain: Not on file  . Food insecurity:    Worry: Not on file    Inability: Not on file  . Transportation needs:    Medical: Not on file    Non-medical: Not on file  Tobacco Use  . Smoking status: Never Smoker  . Smokeless tobacco: Never Used  Substance and Sexual Activity  . Alcohol use: Yes  . Drug use: No  . Sexual activity: Yes    Birth control/protection: Surgical    Comment: spouse had vas  Lifestyle  . Physical activity:    Days per week: Not on file    Minutes per session: Not on file  . Stress: Not on file  Relationships  . Social connections:    Talks on phone: Not on file    Gets together: Not on file    Attends religious service:  Not on file    Active member of club or organization: Not on file    Attends meetings of clubs or organizations: Not on file    Relationship status: Not on file  . Intimate partner violence:    Fear of current or ex partner: Not on file    Emotionally abused: Not on file    Physically abused: Not on file    Forced sexual activity: Not on file  Other Topics Concern  . Not on file  Social History Narrative  . Not on file   Family History  Problem Relation Age of Onset  . Hypertension Mother   . Diabetes Father   . Hypertension Sister   . Hypertension Sister   . Allergic rhinitis Neg Hx   . Angioedema Neg Hx   . Asthma Neg Hx   . Eczema Neg Hx   . Immunodeficiency Neg Hx   . Urticaria Neg Hx      OBJECTIVE:  Vitals:   08/15/18 1839 08/15/18 1840  BP:  134/70  Pulse:  77  Resp:  18  Temp:  98.7 F (37.1 C)  SpO2:  100%  Weight: 177 lb (80.3 kg)    General appearance: AOx3 in no acute distress; nontoxic appearing HEENT: NCAT.  Oropharynx clear.  Lungs: clear to auscultation bilaterally without adventitious breath sounds Heart: Systolic murmur present.  Radial pulses 2+ symmetrical bilaterally Abdomen: soft; non-distended; no tenderness; bowel sounds present; no guarding Back: no CVA tenderness Extremities: no edema; symmetrical with no gross deformities Skin: warm and dry Neurologic: Ambulates from chair to exam table without difficulty Psychological: alert and cooperative; normal mood and affect  Labs Reviewed  POCT URINALYSIS DIP (DEVICE) - Abnormal; Notable for the following components:      Result Value   Bilirubin Urine MODERATE (*)    Ketones, ur 15 (*)    Hgb urine dipstick LARGE (*)    Protein, ur >=300 (*)    Nitrite POSITIVE (*)    Leukocytes, UA TRACE (*)    All other components within normal limits    ASSESSMENT & PLAN:  1. Acute cystitis with hematuria   2. Proteinuria, unspecified type     Meds ordered this encounter  Medications  . cephALEXin (KEFLEX) 500 MG capsule    Sig: Take 1 capsule (500 mg total) by mouth 2 (two) times daily for 7 days.    Dispense:  14 capsule    Refill:  0    Order Specific Question:   Supervising Provider    Answer:   Wynona Luna [phone]  . phenazopyridine (PYRIDIUM) 200 MG tablet    Sig: Take 1 tablet (200 mg total) by mouth 3 (three) times daily.    Dispense:  6 tablet    Refill:  0    Order Specific Question:   Supervising Provider    Answer:   Wynona Luna [[id number]]    Urine culture sent.  We will call you with the results.   Push fluids and get plenty of rest.   Take antibiotic as directed and to completion Take pyridium as prescribed and as needed for symptomatic relief Follow up  with PCP for reevaluation and further evaluation and management of proteinuria within the next week Return here or go to ER if you have any new or worsening symptoms such as fever, worsening abdominal pain, nausea/vomiting, flank pain, etc...  Outlined signs and symptoms indicating need for more acute intervention. Patient verbalized understanding. After Visit Summary  given.     Lestine Box, PA-C 08/15/18 2024

## 2018-08-18 LAB — URINE CULTURE: Culture: >=100000 — AB

## 2018-08-20 ENCOUNTER — Telehealth (HOSPITAL_COMMUNITY): Payer: Self-pay

## 2018-08-20 NOTE — Telephone Encounter (Signed)
Urine culture positive, pt was given keflex at visit. This bacteria is sensitive to Ceftriaxone. Pt reports feeling better.

## 2018-08-21 DIAGNOSIS — R8290 Unspecified abnormal findings in urine: Secondary | ICD-10-CM | POA: Diagnosis not present

## 2018-08-21 DIAGNOSIS — N390 Urinary tract infection, site not specified: Secondary | ICD-10-CM | POA: Diagnosis not present

## 2018-08-21 DIAGNOSIS — R809 Proteinuria, unspecified: Secondary | ICD-10-CM | POA: Diagnosis not present

## 2018-08-27 DIAGNOSIS — R3915 Urgency of urination: Secondary | ICD-10-CM | POA: Diagnosis not present

## 2018-10-23 ENCOUNTER — Other Ambulatory Visit: Payer: Self-pay | Admitting: Obstetrics and Gynecology

## 2018-10-23 DIAGNOSIS — Z1231 Encounter for screening mammogram for malignant neoplasm of breast: Secondary | ICD-10-CM

## 2018-12-03 ENCOUNTER — Ambulatory Visit
Admission: RE | Admit: 2018-12-03 | Discharge: 2018-12-03 | Disposition: A | Discharge status: Home / Self Care | Payer: 59 | Source: Ambulatory Visit | Attending: Obstetrics and Gynecology | Admitting: Obstetrics and Gynecology

## 2018-12-03 DIAGNOSIS — Z1231 Encounter for screening mammogram for malignant neoplasm of breast: Secondary | ICD-10-CM

## 2019-01-03 ENCOUNTER — Other Ambulatory Visit: Payer: Self-pay

## 2019-01-03 ENCOUNTER — Ambulatory Visit (HOSPITAL_COMMUNITY)
Admission: EM | Admit: 2019-01-03 | Discharge: 2019-01-03 | Disposition: A | Discharge status: Home / Self Care | Payer: 59 | Attending: Family Medicine | Admitting: Family Medicine

## 2019-01-03 ENCOUNTER — Encounter (HOSPITAL_COMMUNITY): Payer: Self-pay

## 2019-01-03 DIAGNOSIS — H109 Unspecified conjunctivitis: Secondary | ICD-10-CM

## 2019-01-03 MED ORDER — POLYMYXIN B-TRIMETHOPRIM 10000-0.1 UNIT/ML-% OP SOLN: 1.0000 [drp] | OPHTHALMIC | 0 refills | Status: DC

## 2019-01-03 NOTE — ED Triage Notes (Signed)
Patient presents to Urgent Care with complaints of bilateral eye redness and some crusty reside since this morning. Patient states she does not have small children that live in the house with her.

## 2019-01-03 NOTE — ED Provider Notes (Signed)
[facility]    CSN: [id number] Arrival date & time: 01/03/19  0759     History   Chief Complaint Chief Complaint  Patient presents with  . Eye Problem    HPI Kathryn Sandoval is a 48 y.o. female.   Pt is a 48 year old female that presents with bilateral eye redness, worse on the left.  This started yesterday.  She woke up this morning with crusting around both eyes.  Symptoms have been constant.  She has been using Flonase nasal spray.  Her eyes are itchy.  Denies any pain or trouble with vision.  Denies any associated fevers, chills, nasal congestion, rhinorrhea.  ROS per HPI    Eye Problem    Past Medical History:  Diagnosis Date  . Anemia   . Bartholin's gland abscess    RIGHT  . Condyloma    H/O  . Heart murmur   . 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 VIN I (vulvar intraepithelial neoplasia I)     There are no active problems to display for this patient.   Past Surgical History:  Procedure Laterality Date  . DILITATION & CURRETTAGE/HYSTROSCOPY WITH THERMACHOICE ABLATION N/A 03/12/2013   Procedure: DILATATION & CURETTAGE/HYSTEROSCOPY WITH THERMACHOICE ABLATION;  Surgeon: Betsy Coder, MD;  Location: [facility];  Service: Gynecology;  Laterality: N/A;  D&C with Removal of Prolapsing Fibroid, Thermachoice EM Ablation  . EYE SURGERY    . VULVA /PERINEUM BIOPSY    . WISDOM TOOTH EXTRACTION      OB History    Gravida  3   Para      Term      Preterm      AB      Living  2     SAB      TAB      Ectopic      Multiple      Live Births               Home Medications    Prior to Admission medications   Medication Sig Start Date End Date Taking? Authorizing Provider  albuterol (VENTOLIN HFA) 108 (90 Base) MCG/ACT inhaler Inhale 2 puffs into the lungs every 4 (four) hours as needed for wheezing or shortness of breath. Use 15-20 minutes prior to activity. 12/12/16   Kennith Gain, MD  azelastine  (ASTELIN) 0.1 % nasal spray Place 2 sprays into both nostrils 2 (two) times daily. Use in each nostril as directed 12/12/16   Kennith Gain, MD  fluocinonide ointment (LIDEX) 0.05 %  12/01/16   [provider]  levocetirizine (XYZAL) 5 MG tablet Take 1 tablet (5 mg total) by mouth every evening. 12/12/16   Kennith Gain, MD  meclizine (ANTIVERT) 25 MG tablet Take 25 mg by mouth 3 (three) times daily as needed for dizziness.    [provider]  Multiple Vitamin (MULTIVITAMIN) tablet Take 1 tablet by mouth daily.    [provider]  phenazopyridine (PYRIDIUM) 200 MG tablet Take 1 tablet (200 mg total) by mouth 3 (three) times daily. 08/15/18   Wurst, [place], PA-C  trimethoprim-polymyxin b (POLYTRIM) ophthalmic solution Place 1 drop into both eyes every 4 (four) hours. 01/03/19   Orvan July, NP    Family History Family History  Problem Relation Age of Onset  . Hypertension Mother   . Breast cancer Mother 13  . Diabetes Father   . Hypertension Sister   .  Hypertension Sister   . Allergic rhinitis Neg Hx   . Angioedema Neg Hx   . Asthma Neg Hx   . Eczema Neg Hx   . Immunodeficiency Neg Hx   . Urticaria Neg Hx     Social History Social History   Tobacco Use  . Smoking status: Never Smoker  . Smokeless tobacco: Never Used  Substance Use Topics  . Alcohol use: Yes  . Drug use: No     Allergies   Patient has no known allergies.   Review of Systems Review of Systems   Physical Exam Triage Vital Signs ED Triage Vitals  Enc Vitals Group     BP 01/03/19 0809 121/75     Pulse Rate 01/03/19 0809 69     Resp 01/03/19 0809 18     Temp 01/03/19 0809 98.2 F (36.8 C)     Temp Source 01/03/19 0809 Oral     SpO2 01/03/19 0809 96 %     Weight 01/03/19 0807 176 lb (79.8 kg)     Height 01/03/19 0807 5\' 7"  (1.702 m)     Head Circumference --      Peak Flow --      Pain Score 01/03/19 0807 2     Pain Loc --      Pain Edu? --       Excl. in [place]? --    No data found.  Updated Vital Signs BP 121/75 (BP Location: Left Arm)   Pulse 69   Temp 98.2 F (36.8 C) (Oral)   Resp 18   Ht 5\' 7"  (1.702 m)   Wt 176 lb (79.8 kg)   SpO2 96%   BMI 27.57 kg/m   Visual Acuity Right Eye Distance:   Left Eye Distance:   Bilateral Distance:    Right Eye Near:   Left Eye Near:    Bilateral Near:     Physical Exam Vitals signs and nursing note reviewed.  Constitutional:      General: She is not in acute distress.    Appearance: Normal appearance. She is normal weight. She is not ill-appearing, toxic-appearing or diaphoretic.  HENT:     Head: Normocephalic and atraumatic.     Right Ear: External ear normal.     Left Ear: External ear normal.     Nose: Nose normal.     Mouth/Throat:     Pharynx: Oropharynx is clear.  Eyes:     Extraocular Movements: Extraocular movements intact.     Conjunctiva/sclera:     Right eye: Right conjunctiva is injected.     Left eye: Left conjunctiva is injected.     Pupils: Pupils are equal, round, and reactive to light.  Neck:     Musculoskeletal: Normal range of motion.  Pulmonary:     Effort: Pulmonary effort is normal.  Musculoskeletal: Normal range of motion.  Skin:    General: Skin is warm and dry.  Neurological:     Mental Status: She is alert.  Psychiatric:        Mood and Affect: Mood normal.      UC Treatments / Results  Labs (all labs ordered are listed, but only abnormal results are displayed) Labs Reviewed - No data to display  EKG None  Radiology No results found.  Procedures Procedures (including critical care time)  Medications Ordered in UC Medications - No data to display  Initial Impression / Assessment and Plan / UC Course  I have reviewed the triage vital  signs and the nursing notes.  Pertinent labs & imaging results that were available during my care of the patient were reviewed by me and considered in my medical decision making (see chart for  details).     Conjunctivitis-we will treat with Polytrim every 4 hours while awake Recommended continuing her Flonase nasal spray and starting Zyrtec daily Follow up as needed for continued or worsening symptoms  Final Clinical Impressions(s) / UC Diagnoses   Final diagnoses:  Conjunctivitis of both eyes, unspecified conjunctivitis type     Discharge Instructions     We will given you some eye drops to use every 4 hours while awake Continue the Flonase daily.  Recommend zyrtec daily or any other antihistamine.  Follow up as needed for continued or worsening symptoms     ED Prescriptions    Medication Sig Dispense Auth. Provider   trimethoprim-polymyxin b (POLYTRIM) ophthalmic solution Place 1 drop into both eyes every 4 (four) hours. 10 mL Loura Halt A, NP     Controlled Substance Prescriptions [place] Controlled Substance Registry consulted? Not Applicable   Orvan July, NP 01/03/19 [id number]

## 2019-01-03 NOTE — Discharge Instructions (Signed)
We will given you some eye drops to use every 4 hours while awake Continue the Flonase daily.  Recommend zyrtec daily or any other antihistamine.  Follow up as needed for continued or worsening symptoms

## 2019-09-17 ENCOUNTER — Other Ambulatory Visit: Payer: Self-pay

## 2019-10-25 ENCOUNTER — Other Ambulatory Visit: Payer: Self-pay | Admitting: Obstetrics and Gynecology

## 2019-10-25 DIAGNOSIS — Z1231 Encounter for screening mammogram for malignant neoplasm of breast: Secondary | ICD-10-CM

## 2019-11-21 NOTE — Progress Notes (Signed)
[facility] Note  11/27/2019     CHIEF COMPLAINT Patient presents for Retina Evaluation   HISTORY OF PRESENT ILLNESS: Kathryn Sandoval is a 49 y.o. female who presents to the clinic today for:   HPI    Retina Evaluation    In right eye.  Onset: Unknown.  Duration of 6 days.  Associated Symptoms Floaters.  Context:  distance vision, mid-range vision and near vision.  Treatments tried include no treatments.  I, the attending physician,  performed the HPI with the patient and updated documentation appropriately.          Comments    49 y/o female pt referred by Shirleen Schirmer, PA on 11/21/19 for eval of operculated hole OD.  Pt was seen for routine eye exam, and had no idea anything was wrong.  VA good ou Craig (s/p lasik OU in 2001 - [place]).  Denies pain, flashes,  Reports a few small floaters OU.  No gtts.       Last edited by Bernarda Caffey, MD on 11/27/2019  5:11 PM. (History)    pt states she was referred here by Shirleen Schirmer for a possible hole in her right eye, she states she went to see him for a routine eye exam, and she is not having any problems, she denies flashes or floaters, she had lasik with Dr. Lucita Ferrara, but no other eye sx  Referring physician: Shirleen Schirmer, PA-C  [address] [place],  [place] [address]  HISTORICAL INFORMATION:   Selected notes from the [facility]: Current Outpatient Medications (Ophthalmic Drugs)  Medication Sig  . trimethoprim-polymyxin b (POLYTRIM) ophthalmic solution Place 1 drop into both eyes every 4 (four) hours. (Patient not taking: Reported on 11/27/2019)   No current facility-administered medications for this visit. (Ophthalmic Drugs)   Current Outpatient Medications (Other)  Medication Sig  . albuterol (VENTOLIN HFA) 108 (90 Base) MCG/ACT inhaler Inhale 2 puffs into the lungs every 4 (four) hours as needed for wheezing or shortness of breath. Use 15-20  minutes prior to activity.  Marland Kitchen azelastine (ASTELIN) 0.1 % nasal spray Place 2 sprays into both nostrils 2 (two) times daily. Use in each nostril as directed  . fluocinonide ointment (LIDEX) 0.05 %   . gabapentin (NEURONTIN) 300 MG capsule gabapentin 300 mg capsule  . GAVILYTE-N WITH FLAVOR PACK 420 g solution See admin instructions.  Marland Kitchen levocetirizine (XYZAL) 5 MG tablet Take 1 tablet (5 mg total) by mouth every evening.  . meclizine (ANTIVERT) 25 MG tablet Take 25 mg by mouth 3 (three) times daily as needed for dizziness.  . Multiple Vitamin (MULTIVITAMIN) tablet Take 1 tablet by mouth daily.  . phenazopyridine (PYRIDIUM) 200 MG tablet Take 1 tablet (200 mg total) by mouth 3 (three) times daily.   No current facility-administered medications for this visit. (Other)      REVIEW OF SYSTEMS: ROS    Positive for: Cardiovascular, Eyes   Negative for: Constitutional, Gastrointestinal, Neurological, Skin, Genitourinary, Musculoskeletal, HENT, Endocrine, Respiratory, Psychiatric, Allergic/Imm, Heme/Lymph   Last edited by Matthew Folks, COA on 11/27/2019  1:54 PM. (History)       ALLERGIES No Known Allergies  PAST MEDICAL HISTORY Past Medical History:  Diagnosis Date  . Anemia   . Bartholin's gland abscess    RIGHT  . Condyloma    H/O  . Heart murmur   . HSV (herpes simplex virus) infection  LAST OUTBREAK 1998  . VIN I (vulvar intraepithelial neoplasia I)    Past Surgical History:  Procedure Laterality Date  . DILITATION & CURRETTAGE/HYSTROSCOPY WITH THERMACHOICE ABLATION N/A 03/12/2013   Procedure: DILATATION & CURETTAGE/HYSTEROSCOPY WITH THERMACHOICE ABLATION;  Surgeon: Betsy Coder, MD;  Location: [facility];  Service: Gynecology;  Laterality: N/A;  D&C with Removal of Prolapsing Fibroid, Thermachoice EM Ablation  . EYE SURGERY    . LASIK Bilateral 2001   Dr. Lucita Ferrara  . VULVA /PERINEUM BIOPSY    . WISDOM TOOTH EXTRACTION      FAMILY HISTORY Family History  Problem  Relation Age of Onset  . Hypertension Mother   . Breast cancer Mother 64  . Diabetes Father   . Hypertension Sister   . Hypertension Sister   . Allergic rhinitis Neg Hx   . Angioedema Neg Hx   . Asthma Neg Hx   . Eczema Neg Hx   . Immunodeficiency Neg Hx   . Urticaria Neg Hx     SOCIAL HISTORY Social History   Tobacco Use  . Smoking status: Never Smoker  . Smokeless tobacco: Never Used  Substance Use Topics  . Alcohol use: Yes  . Drug use: No         OPHTHALMIC EXAM:  Base Eye Exam    Visual Acuity (Snellen - Linear)      Right Left   Dist Stout 20/20 -2 20/20 -2       Tonometry (Tonopen, 1:58 PM)      Right Left   Pressure 14 13       Pupils      Dark Light Shape React APD   Right 3 2 Round Brisk None   Left 3 2 Round Brisk None       Visual Fields (Counting fingers)      Left Right    Full Full       Extraocular Movement      Right Left    Full, Ortho Full, Ortho       Neuro/Psych    Oriented x3: Yes   Mood/Affect: Normal       Dilation    Both eyes: 1.0% Mydriacyl, 2.5% Phenylephrine @ 1:58 PM        Slit Lamp and Fundus Exam    Slit Lamp Exam      Right Left   Lids/Lashes Normal Normal   Conjunctiva/Sclera nasal Pinguecula nasal Pinguecula   Cornea Mild Arcus, well healed lasik flap Mild Arcus, well healed lasik flap   Anterior Chamber Deep and quiet Deep and quiet   Iris Round and dilated Round and dilated   Lens 1-2+ Nuclear sclerosis, 2+ Cortical cataract 1-2+ Nuclear sclerosis, 2+ Cortical cataract   Vitreous Vitreous syneresis, mild vitreous condensations superiorly Vitreous syneresis       Fundus Exam      Right Left   Disc Compact, Pink and Sharp Compact, Pink and Sharp   C/D Ratio 0.1 0.1   Macula Flat, Good foveal reflex, mild Retinal pigment epithelial mottling, No heme or edema Flat, Good foveal reflex, mild Retinal pigment epithelial mottling, No heme or edema   Vessels Normal Normal   Periphery Attached; no RT/RD on  scleral depressed exam Attached; no RT/RD        Refraction    Manifest Refraction      Sphere Cylinder Dist VA   Right -0.50 Sphere 20/20   Left -0.50 Sphere 20/20  IMAGING AND PROCEDURES  Imaging and Procedures for @TODAY @  OCT, Retina - OU - Both Eyes       Right Eye Quality was good. Central Foveal Thickness: 250. Progression has no prior data. Findings include normal foveal contour, no IRF, no SRF, vitreomacular adhesion .   Left Eye Quality was good. Central Foveal Thickness: 251. Progression has no prior data. Findings include vitreomacular adhesion .   Notes *Images captured and stored on drive  Diagnosis / Impression:  NFP, no IRF/SRF OU VMA OU  Clinical management:  See below  Abbreviations: NFP - Normal foveal profile. CME - cystoid macular edema. PED - pigment epithelial detachment. IRF - intraretinal fluid. SRF - subretinal fluid. EZ - ellipsoid zone. ERM - epiretinal membrane. ORA - outer retinal atrophy. ORT - outer retinal tubulation. SRHM - subretinal hyper-reflective material                 ASSESSMENT/PLAN:    ICD-10-CM   1. 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 Retinal edema  H35.81 OCT, Retina - OU - Both Eyes  4. Nuclear sclerosis of both eyes  H25.13   5. Hx of LASIK  Z98.890     1,2. Vitreous syneresis OU  - prominent vitreous opacities/condensations OD -- superior periphery  - no RT/RD on scleral depressed exam  - discussed findings  - f/u here prn  3. No retinal edema on exam or OCT  4. Nuclear sclerosis OU  - mild  - monitor  5. History of LASIK  - stable  - monitor   Ophthalmic Meds Ordered this visit:  No orders of the defined types were placed in this encounter.      Return if symptoms worsen or fail to improve.  There are no Patient Instructions on file for this visit.   Explained the diagnoses, plan, and follow up with the patient and they  expressed understanding.  Patient expressed understanding of the importance of proper follow up care.   This document serves as a record of services personally performed by Gardiner Sleeper, MD, PhD. It was created on their behalf by Roselee Nova, COMT. The creation of this record is the provider's dictation and/or activities during the visit.  Electronically signed by: Roselee Nova, COMT 11/27/19 5:19 PM   This document serves as a record of services personally performed by Gardiner Sleeper, MD, PhD. It was created on their behalf by Ernest Mallick, OA, an ophthalmic assistant. The creation of this record is the provider's dictation and/or activities during the visit.    Electronically signed by: Ernest Mallick, OA 02.10.2021 5:19 PM   Gardiner Sleeper, M.D., Ph.D. Diseases & Surgery of the Retina and Vitreous [facility]  I have reviewed the above documentation for accuracy and completeness, and I agree with the above. Gardiner Sleeper, M.D., Ph.D. 11/27/19 5:19 PM    Abbreviations: M myopia (nearsighted); A astigmatism; H hyperopia (farsighted); P presbyopia; Mrx spectacle prescription;  CTL contact lenses; OD right eye; OS left eye; OU both eyes  XT exotropia; ET esotropia; PEK punctate epithelial keratitis; PEE punctate epithelial erosions; DES dry eye syndrome; MGD meibomian gland dysfunction; ATs artificial tears; PFAT's preservative free artificial tears; [place] nuclear sclerotic cataract; PSC posterior subcapsular cataract; ERM epi-retinal membrane; PVD posterior vitreous detachment; RD retinal detachment; DM diabetes mellitus; DR diabetic retinopathy; NPDR non-proliferative diabetic retinopathy; PDR proliferative diabetic retinopathy; CSME  clinically significant macular edema; DME diabetic macular edema; dbh dot blot hemorrhages; CWS cotton wool spot; POAG primary open angle glaucoma; C/D cup-to-disc ratio; HVF humphrey visual field; GVF goldmann visual field; OCT optical  coherence tomography; IOP intraocular pressure; BRVO Branch retinal vein occlusion; CRVO central retinal vein occlusion; CRAO central retinal artery occlusion; BRAO branch retinal artery occlusion; RT retinal tear; SB scleral buckle; PPV pars plana vitrectomy; VH Vitreous hemorrhage; PRP panretinal laser photocoagulation; IVK intravitreal kenalog; VMT vitreomacular traction; MH Macular hole;  NVD neovascularization of the disc; NVE neovascularization elsewhere; AREDS age related eye disease study; ARMD age related macular degeneration; POAG primary open angle glaucoma; EBMD epithelial/anterior basement membrane dystrophy; ACIOL anterior chamber intraocular lens; IOL intraocular lens; PCIOL posterior chamber intraocular lens; Phaco/IOL phacoemulsification with intraocular lens placement; [place] photorefractive keratectomy; LASIK laser assisted in situ keratomileusis; HTN hypertension; DM diabetes mellitus; COPD chronic obstructive pulmonary disease

## 2019-11-27 ENCOUNTER — Encounter (INDEPENDENT_AMBULATORY_CARE_PROVIDER_SITE_OTHER): Payer: Self-pay | Admitting: Ophthalmology

## 2019-11-27 ENCOUNTER — Ambulatory Visit (INDEPENDENT_AMBULATORY_CARE_PROVIDER_SITE_OTHER): Payer: 59 | Admitting: Ophthalmology

## 2019-11-27 DIAGNOSIS — H43393 Other vitreous opacities, bilateral: Secondary | ICD-10-CM

## 2019-11-27 DIAGNOSIS — Z9889 Other specified postprocedural states: Secondary | ICD-10-CM

## 2019-11-27 DIAGNOSIS — H43391 Other vitreous opacities, right eye: Secondary | ICD-10-CM

## 2019-11-27 DIAGNOSIS — H3581 Retinal edema: Secondary | ICD-10-CM | POA: Diagnosis not present

## 2019-11-27 DIAGNOSIS — H2513 Age-related nuclear cataract, bilateral: Secondary | ICD-10-CM | POA: Diagnosis not present

## 2019-12-05 ENCOUNTER — Ambulatory Visit: Payer: Self-pay

## 2020-01-03 ENCOUNTER — Ambulatory Visit: Payer: Self-pay

## 2020-01-03 ENCOUNTER — Other Ambulatory Visit: Payer: Self-pay

## 2020-01-03 ENCOUNTER — Ambulatory Visit
Admission: RE | Admit: 2020-01-03 | Discharge: 2020-01-03 | Disposition: A | Discharge status: Home / Self Care | Payer: 59 | Source: Ambulatory Visit | Attending: Obstetrics and Gynecology | Admitting: Obstetrics and Gynecology

## 2020-01-03 DIAGNOSIS — Z1231 Encounter for screening mammogram for malignant neoplasm of breast: Secondary | ICD-10-CM

## 2020-05-21 ENCOUNTER — Other Ambulatory Visit: Payer: Self-pay | Admitting: Obstetrics and Gynecology

## 2020-09-08 ENCOUNTER — Other Ambulatory Visit: Payer: Self-pay | Admitting: Obstetrics and Gynecology

## 2020-09-08 DIAGNOSIS — Z1231 Encounter for screening mammogram for malignant neoplasm of breast: Secondary | ICD-10-CM

## 2021-01-29 ENCOUNTER — Ambulatory Visit
Admission: RE | Admit: 2021-01-29 | Discharge: 2021-01-29 | Disposition: A | Discharge status: Home / Self Care | Payer: 59 | Source: Ambulatory Visit | Attending: Obstetrics and Gynecology | Admitting: Obstetrics and Gynecology

## 2021-01-29 ENCOUNTER — Other Ambulatory Visit: Payer: Self-pay

## 2021-01-29 DIAGNOSIS — Z1231 Encounter for screening mammogram for malignant neoplasm of breast: Secondary | ICD-10-CM

## 2021-09-17 ENCOUNTER — Other Ambulatory Visit: Payer: Self-pay

## 2021-09-17 ENCOUNTER — Ambulatory Visit: Payer: 59 | Admitting: Internal Medicine

## 2021-09-17 ENCOUNTER — Encounter: Payer: Self-pay | Admitting: Internal Medicine

## 2021-09-17 VITALS — BP 122/74 | HR 82 | Temp 98.3°F | Ht 67.0 in | Wt 190.4 lb

## 2021-09-17 DIAGNOSIS — J4530 Mild persistent asthma, uncomplicated: Secondary | ICD-10-CM | POA: Diagnosis not present

## 2021-09-17 DIAGNOSIS — R053 Chronic cough: Secondary | ICD-10-CM | POA: Diagnosis not present

## 2021-09-17 LAB — NITRIC OXIDE: Nitric Oxide: 47

## 2021-09-17 NOTE — Progress Notes (Signed)
Kathryn Sandoval    [id number]    1971/10/11  Primary Care Physician:Miller, Lattie Haw, MD  Referring Physician: Orvan July, NP [place],  [place] [address] Reason for Consultation: chronic cough Date of Consultation: 09/17/2021  Chief complaint:   Chief Complaint  Patient presents with   Consult    Pt states she has had a cough which she stated first began about 5 years ago. States when the weather is cold, the cough begins. States about 3 weeks ago she did have some wheezing.     HPI: Kathryn Sandoval is a 50 y.o. woman who presents with chronic cough. Notes cough every fall, started 4-5 years ago. Cough is dry.   Inhaler albuterol prn did help the first year, but now it's not helping.   She is very active and her symptoms are worse with running. She has been taking albuterol to exercise for a few years.   Symptoms worsened  when she had did have wheezing in November. Symptoms improved with prednisone which she got mid-November.   She has also been treated with   No nasal congestion, no itchy, or watery eyes.  No childhood asthma or respiratory disease. No family history of asthma.   She has taken zyrtec in the past and that does nothing for her cough. Denies raspy voice, but she does have post nasal drainage.   She has identified triggers for her coughing - cold air, smoke, seasonal changes, eating nuts   Social history:  Occupation: she is a Education officer, museum with social services, and now works in hospice.  Exposures: lives at home independently, no pets.  She's from [place].  Smoking history: never smoker, passive smoke exposure in childhood.   Social History   Occupational History   Not on file  Tobacco Use   Smoking status: Never   Smokeless tobacco: Never  Substance and Sexual Activity   Alcohol use: Yes   Drug use: No   Sexual activity: Yes    Birth control/protection: Surgical    Comment: spouse had vas    Relevant family  history:  Family History  Problem Relation Age of Onset   Hypertension Mother    Breast cancer Mother 59   Diabetes Father    Hypertension Sister    Hypertension Sister    Breast cancer Maternal Aunt    Breast cancer Cousin    Allergic rhinitis Neg Hx    Angioedema Neg Hx    Asthma Neg Hx    Eczema Neg Hx    Immunodeficiency Neg Hx    Urticaria Neg Hx     Past Medical History:  Diagnosis Date   Anemia    Bartholin's gland abscess    RIGHT   Condyloma    H/O   Heart murmur    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 Kinnie Feil Milagros Loll BIOPSY     WISDOM TOOTH EXTRACTION       Physical  Exam: Blood pressure 122/74, pulse 82, temperature 98.3 F (36.8 C), temperature source Oral, height 5\' 7"  (1.702 m), weight 190 lb 6.4 oz (86.4 kg), SpO2 100 %. Gen:      No acute distress ENT:  mld septal deviation to the right, no nasal polyps, mucus membranes moist Lungs:    No increased respiratory effort, symmetric chest wall excursion, clear to auscultation bilaterally, no wheezes or crackles CV:         Regular rate and rhythm; no murmurs, rubs, or gallops.  No pedal edema Abd:      + bowel sounds; soft, non-tender; no distension MSK: no acute synovitis of DIP or PIP joints, no mechanics hands.  Skin:      Warm and dry; no rashes Neuro: normal speech, no focal facial asymmetry Psych: alert and oriented x3, normal mood and affect   Data Reviewed/Medical Decision Making:  Independent  interpretation of tests: Imaging:   PFTs: Spirometry reviewed 2018 from allergy wnl No flowsheet data found.  Labs:  Lab Results  Component Value Date   WBC 8.6 03/12/2013   HGB 13.6 03/12/2013   HCT 42.3 03/12/2013   MCV 78.5 03/12/2013   PLT 377 03/12/2013   No results found for: NA, K, CL, CO2   Immunization status:  Immunization History  Administered Date(s) Administered   Influenza Split 09/06/2018, 09/04/2019   Influenza,inj,Quad PF,6+ Mos 10/27/2017, 08/18/2021   Influenza,inj,quad, With Preservative 07/17/2020   PFIZER(Purple Top)SARS-COV-2 Vaccination 11/09/2019, 11/30/2019, 07/22/2020, 02/07/2021   Tdap 09/20/2011     I reviewed prior external note(s) from PCP [facility] physicians  I reviewed the result(s) of the labs and imaging as noted above.   I have ordered PFT   Assessment:  Chronic cough Possible mild persistent asthma  Plan/Recommendations:  Differential diagnosis includes asthma, less likely gerd, post nasal drainage.  Start advair diskus 100 1 puff twice a day. Gargle after use. Did inhaler teaching today as well.  Continue prn albuterol Have ordered spirometry and feno today.   We discussed disease management and progression at length today.    Return to Care: Return in about 4 weeks (around 10/15/2021).  Lenice Llamas, MD Pulmonary and [place]  CC: Orvan July, NP

## 2021-09-17 NOTE — Progress Notes (Signed)
The patient has been prescribed the inhaler advair. Inhaler technique was demonstrated to patient. The patient subsequently demonstrated correct technique.  

## 2021-09-17 NOTE — Patient Instructions (Addendum)
Please schedule follow up scheduled with myself in 1 months.  If my schedule is not open yet, we will contact you with a reminder closer to that time. Please call [phone] if you haven't heard from [place] a month before.   Before your next visit I would like you to have: Spirometry/Feno - breathing testing before next appt.   Start taking advair. Gargle after use.  By learning about asthma and how it can be controlled, you take an important step toward managing this disease. Work closely with your asthma care team to learn all you can about your asthma, how to avoid triggers, what your medications do, and how to take them correctly. With proper care, you can live free of asthma symptoms and maintain a normal, healthy lifestyle.   What is asthma? Asthma is a chronic disease that affects the airways of the lungs. During normal breathing, the bands of muscle that surround the airways are relaxed and air moves freely. During an asthma episode or "attack," there are three main changes that stop air from moving easily through the airways: The bands of muscle that surround the airways tighten and make the airways narrow. This tightening is called bronchospasm.  The lining of the airways becomes swollen or inflamed.  The cells that line the airways produce more mucus, which is thicker than normal and clogs the airways.  These three factors - bronchospasm, inflammation, and mucus production - cause symptoms such as difficulty breathing, wheezing, and coughing.  What are the most common symptoms of asthma? Asthma symptoms are not the same for everyone. They can even change from episode to episode in the same person. Also, you may have only one symptom of asthma, such as cough, but another person may have all the symptoms of asthma. It is important to know all the symptoms of asthma and to be aware that your asthma can present in any of these ways at any time. The most common symptoms include: Coughing,  especially at night  Shortness of breath  Wheezing  Chest tightness, pain, or pressure   Who is affected by asthma? Asthma affects 22 million Americans; about 6 million of these are children under age 38. People who have a family history of asthma have an increased risk of developing the disease. Asthma is also more common in people who have allergies or who are exposed to tobacco smoke. However, anyone can develop asthma at any time. Some people may have asthma all of their lives, while others may develop it as adults.  What causes asthma? The airways in a person with asthma are very sensitive and react to many things, or "triggers." Contact with these triggers causes asthma symptoms. One of the most important parts of asthma control is to identify your triggers and then avoid them when possible. The only trigger you do not want to avoid is exercise. Pre-treatment with medicines before exercise can allow you to stay active yet avoid asthma symptoms. Common asthma triggers include: Infections (colds, viruses, flu, sinus infections)  Exercise  Weather (changes in temperature and/or humidity, cold air)  Tobacco smoke  Allergens (dust mites, pollens, pets, mold spores, cockroaches, and sometimes foods)  Irritants (strong odors from cleaning products, perfume, wood smoke, air pollution)  Strong emotions such as crying or laughing hard  Some medications   How is asthma diagnosed? To diagnose asthma, your doctor will first review your medical history, family history, and symptoms. Your doctor will want to know any past history of  breathing problems you may have had, as well as a family history of asthma, allergies, eczema (a bumpy, itchy skin rash caused by allergies), or other lung disease. It is important that you describe your symptoms in detail (cough, wheeze, shortness of breath, chest tightness), including when and how often they occur. The doctor will perform a physical examination and listen  to your heart and lungs. He or she may also order breathing tests, allergy tests, blood tests, and chest and sinus X-rays. The tests will find out if you do have asthma and if there are any other conditions that are contributing factors.  How is asthma treated? Asthma can be controlled, but not cured. It is not normal to have frequent symptoms, trouble sleeping, or trouble completing tasks. Appropriate asthma care will prevent symptoms and visits to the emergency room and hospital. Asthma medicines are one of the mainstays of asthma treatment. The drugs used to treat asthma are explained below.  Anti-inflammatories: These are the most important drugs for most people with asthma. Anti-inflammatory drugs reduce swelling and mucus production in the airways. As a result, airways are less sensitive and less likely to react to triggers. These medications need to be taken daily and may need to be taken for several weeks before they begin to control asthma. Anti-inflammatory medicines lead to fewer symptoms, better airflow, less sensitive airways, less airway damage, and fewer asthma attacks. If taken every day, they CONTROL or prevent asthma symptoms.   Bronchodilators: These drugs relax the muscle bands that tighten around the airways. This action opens the airways, letting more air in and out of the lungs and improving breathing. Bronchodilators also help clear mucus from the lungs. As the airways open, the mucus moves more freely and can be coughed out more easily. In short-acting forms, bronchodilators RELIEVE or stop asthma symptoms by quickly opening the airways and are very helpful during an asthma episode. In long-acting forms, bronchodilators provide CONTROL of asthma symptoms and prevent asthma episodes.  Asthma drugs can be taken in a variety of ways. Inhaling the medications by using a metered dose inhaler, dry powder inhaler, or nebulizer is one way of taking asthma medicines. Oral medicines (pills or  liquids you swallow) may also be prescribed.  Asthma severity Asthma is classified as either "intermittent" (comes and goes) or "persistent" (lasting). Persistent asthma is further described as being mild, moderate, or severe. The severity of asthma is based on how often you have symptoms both during the day and night, as well as by the results of lung function tests and by how well you can perform activities. The "severity" of asthma refers to how "intense" or "strong" your asthma is.  Asthma control Asthma control is the goal of asthma treatment. Regardless of your asthma severity, it may or may not be controlled. Asthma control means: You are able to do everything you want to do at work and home  You have no (or minimal) asthma symptoms  You do not wake up from your sleep or earlier than usual in the morning due to asthma  You rarely need to use your reliever medicine (inhaler)  Another major part of your treatment is that you are happy with your asthma care and believe your asthma is controlled.  Monitoring symptoms A key part of treatment is keeping track of how well your lungs are working. Monitoring your symptoms  what they are, how and when they happen, and how severe they are  is an important part of  being able to control your asthma.  Sometimes asthma is monitored using a peak flow meter. A peak flow (PF) meter measures how fast the air comes out of your lungs. It can help you know when your asthma is getting worse, sometimes even before you have symptoms. By taking daily peak flow readings, you can learn when to adjust medications to keep asthma under good control. It is also used to create your asthma action plan (see below). Your doctor can use your peak flow readings to adjust your treatment plan in some cases.  Asthma Action Plan Based on your history and asthma severity, you and your doctor will develop a care plan called an "asthma action plan." The asthma action plan  describes when and how to use your medicines, actions to take when asthma worsens, and when to seek emergency care. Make sure you understand this plan. If you do not, ask your asthma care provider any questions you may have. Your asthma action plan is one of the keys to controlling asthma. Keep it readily available to remind you of what you need to do every day to control asthma and what you need to do when symptoms occur.  Goals of asthma therapy These are the goals of asthma treatment: Live an active, normal life  Prevent chronic and troublesome symptoms  Attend work or school every day  Perform daily activities without difficulty  Stop urgent visits to the doctor, emergency department, or hospital  Use and adjust medications to control asthma with few or no side effects

## 2021-09-21 ENCOUNTER — Encounter: Payer: Self-pay | Admitting: Internal Medicine

## 2021-09-22 MED ORDER — CLOTRIMAZOLE 10 MG MT TROC: 10.0000 mg | Freq: Every day | OROMUCOSAL | 0 refills | Status: AC

## 2021-09-22 NOTE — Telephone Encounter (Signed)
Received the following message from patient:   "Hi Dr. Shearon Stalls, Since using the advair diskus my mouth has become sore/irritated and my tongue appears to have thrush on it. I have been rinsing after using the advair. Any suggestions?   Thanks, [place] Sand[place]"  Dr. Shearon Stalls, can you please advise? Thanks!

## 2021-10-22 ENCOUNTER — Ambulatory Visit: Payer: 59 | Admitting: Internal Medicine

## 2021-12-17 ENCOUNTER — Other Ambulatory Visit: Payer: Self-pay | Admitting: Obstetrics and Gynecology

## 2021-12-17 DIAGNOSIS — Z1231 Encounter for screening mammogram for malignant neoplasm of breast: Secondary | ICD-10-CM

## 2022-02-10 ENCOUNTER — Ambulatory Visit
Admission: RE | Admit: 2022-02-10 | Discharge: 2022-02-10 | Disposition: A | Discharge status: Home / Self Care | Payer: 59 | Source: Ambulatory Visit | Attending: Obstetrics and Gynecology | Admitting: Obstetrics and Gynecology

## 2022-02-10 DIAGNOSIS — Z1231 Encounter for screening mammogram for malignant neoplasm of breast: Secondary | ICD-10-CM

## 2022-11-23 ENCOUNTER — Other Ambulatory Visit: Payer: Self-pay | Admitting: Obstetrics and Gynecology

## 2022-11-23 DIAGNOSIS — Z1231 Encounter for screening mammogram for malignant neoplasm of breast: Secondary | ICD-10-CM

## 2023-02-13 ENCOUNTER — Ambulatory Visit: Payer: 59

## 2023-02-22 ENCOUNTER — Encounter (HOSPITAL_BASED_OUTPATIENT_CLINIC_OR_DEPARTMENT_OTHER): Payer: Self-pay | Admitting: Orthopaedic Surgery

## 2023-02-22 ENCOUNTER — Other Ambulatory Visit: Payer: Self-pay

## 2023-02-24 NOTE — H&P (Signed)
ORTHOPAEDIC SURGERY H&P  Subjective:  The patient presents with right hallux rigidus.   Past Medical History:  Diagnosis Date   Anemia    Bartholin's gland abscess    RIGHT   Condyloma    H/O   Family history of adverse reaction to anesthesia    sister with itching   Heart murmur    HSV (herpes simplex virus) infection    LAST OUTBREAK 1998   PONV (postoperative nausea and vomiting)    VIN I (vulvar intraepithelial neoplasia I)     Past Surgical History:  Procedure Laterality Date   DILITATION & CURRETTAGE/HYSTROSCOPY WITH THERMACHOICE ABLATION N/A 03/12/2013   Procedure: DILATATION & CURETTAGE/HYSTEROSCOPY WITH THERMACHOICE ABLATION;  Surgeon: Michael Litter, MD;  Location: [facility];  Service: Gynecology;  Laterality: N/A;  D&C with Removal of Prolapsing Fibroid, Thermachoice EM Ablation   EYE SURGERY     LASIK Bilateral 2001   Dr. Gailen Shelter Ples Specter BIOPSY     WISDOM TOOTH EXTRACTION       (Not in an outpatient encounter)    No Known Allergies  Social History   Socioeconomic History   Marital status: Married    Spouse name: Not on file   Number of children: Not on file   Years of education: Not on file   Highest education level: Not on file  Occupational History   Not on file  Tobacco Use   Smoking status: Never   Smokeless tobacco: Never  Substance and Sexual Activity   Alcohol use: Yes   Drug use: No   Sexual activity: Yes    Birth control/protection: Surgical    Comment: spouse had vas  Other Topics Concern   Not on file  Social History Narrative   Not on file   Social Determinants of Health   Financial Resource Strain: Not on file  Food Insecurity: Not on file  Transportation Needs: Not on file  Physical Activity: Not on file  Stress: Not on file  Social Connections: Not on file  Intimate Partner Violence: Not on file     History reviewed. No pertinent family history.   Review of Systems Pertinent items are noted in  HPI.  Objective: Vital signs in last 24 hours:    02/22/2023    1:53 PM 09/17/2021    2:06 PM 01/03/2019    8:09 AM  Vitals with BMI  Height 5' 7.5" 5\' 7"    Weight 178 lbs 190 lbs 6 oz   BMI 27.45 29.81   Systolic  122 121  Diastolic  74 75  Pulse  82 69      EXAM: General: Well nourished, well developed. Awake, alert and oriented to time, place, person. Normal mood and affect. No apparent distress. Breathing room air.  Operative Lower Extremity: Alignment - Neutral Deformity - None Skin intact Tenderness to palpation - right 1st MTP 5/5 TA, PT, GS, Per, EHL, FHL Sensation intact to light touch throughout Palpable DP and PT pulses Special testing: None  The contralateral foot/ankle was examined for comparison and noted to be neurovascularly intact with no localized deformity, swelling, or tenderness.  Imaging Review All images taken were independently reviewed by me.  Assessment/Plan: The clinical and radiographic findings were reviewed and discussed at length with the patient.  The patient has right hallux rigidus.  We spoke at length about the natural course of these findings. We discussed nonoperative and operative treatment options in detail.  The risks and benefits were presented and  reviewed. The risks due to progression of arthritis requiring further intervention, infection, stiffness, nerve/vessel/tendon injury, wound healing issues, failure of this surgery, need for further surgery, thromboembolic events, amputation, death among others were discussed. The patient acknowledged the explanation and agreed to proceed with the plan.  Netta Cedars  Orthopaedic Surgery [facility]

## 2023-02-24 NOTE — Discharge Instructions (Addendum)
Netta Cedars, MD [facility]  Please read the following information regarding your care after surgery.  Medications  You only need a prescription for the narcotic pain medicine (ex. oxycodone, Percocet, Norco).  All of the other medicines listed below are available over the counter. ? Aleve 2 pills twice a day for the first 3 days after surgery. ? acetominophen (Tylenol) 650 mg every 4-6 hours as you need for minor to moderate pain ? oxycodone as prescribed for severe pain  ? To help prevent blood clots, take aspirin (81 mg) twice daily for 42 days after surgery (or total duration of nonweightbearing).  You should also get up every hour while you are awake to move around.  Weight Bearing ? OK to heel weightbear in postop shoe once the nerve block wears off  Cast / Splint / Dressing ? If you have a splint, do NOT remove this. Keep your splint, cast or dressing clean and dry.  Don't put anything (coat hanger, pencil, etc) down inside of it.  If it gets wet, call the office immediately to schedule an appointment for a cast change.  Swelling IMPORTANT: It is normal for you to have swelling where you had surgery. To reduce swelling and pain, keep at least 3 pillows under your leg so that your toes are above your nose and your heel is above the level of your hip.  It may be necessary to keep your foot or leg elevated for several weeks.  This is critical to helping your incisions heal and your pain to feel better.  Follow Up Call my office at [phone] when you are discharged from the hospital or surgery center to schedule an appointment to be seen 7-10 days after surgery.  Call my office at [phone] if you develop a fever >101.5 F, nausea, vomiting, bleeding from the surgical site or severe pain.

## 2023-02-28 NOTE — Anesthesia Preprocedure Evaluation (Signed)
Anesthesia Evaluation  Patient identified by MRN, date of birth, ID band Patient awake    Reviewed: Allergy & Precautions, H&P , NPO status , Patient's Chart, lab work & pertinent test results  History of Anesthesia Complications (+) PONV, Family history of anesthesia reaction and history of anesthetic complications  Airway Mallampati: II  TM Distance: >3 FB Neck ROM: Full    Dental no notable dental hx. (+) Teeth Intact, Dental Advisory Given   Pulmonary neg pulmonary ROS   Pulmonary exam normal breath sounds clear to auscultation       Cardiovascular Exercise Tolerance: Good negative cardio ROS  Rhythm:Regular Rate:Normal     Neuro/Psych negative neurological ROS  negative psych ROS   GI/Hepatic negative GI ROS, Neg liver ROS,,,  Endo/Other  negative endocrine ROS    Renal/GU negative Renal ROS  negative genitourinary   Musculoskeletal   Abdominal   Peds  Hematology  (+) Blood dyscrasia, anemia   Anesthesia Other Findings   Reproductive/Obstetrics negative OB ROS                             Anesthesia Physical Anesthesia Plan  ASA: 2  Anesthesia Plan: General   Post-op Pain Management: Regional block* and Tylenol PO (pre-op)*   Induction: Intravenous  PONV Risk Score and Plan: 4 or greater and Ondansetron, Dexamethasone, Propofol infusion, TIVA and Midazolam  Airway Management Planned: LMA  Additional Equipment:   Intra-op Plan:   Post-operative Plan: Extubation in OR  Informed Consent: I have reviewed the patients History and Physical, chart, labs and discussed the procedure including the risks, benefits and alternatives for the proposed anesthesia with the patient or authorized representative who has indicated his/her understanding and acceptance.     Dental advisory given  Plan Discussed with: CRNA  Anesthesia Plan Comments:        Anesthesia Quick  Evaluation

## 2023-03-01 ENCOUNTER — Ambulatory Visit (HOSPITAL_BASED_OUTPATIENT_CLINIC_OR_DEPARTMENT_OTHER)
Admission: RE | Admit: 2023-03-01 | Discharge: 2023-03-01 | Disposition: A | Discharge status: Home / Self Care | Payer: 59 | Attending: Orthopaedic Surgery | Admitting: Orthopaedic Surgery

## 2023-03-01 ENCOUNTER — Ambulatory Visit (HOSPITAL_BASED_OUTPATIENT_CLINIC_OR_DEPARTMENT_OTHER): Payer: 59 | Admitting: Certified Registered"

## 2023-03-01 ENCOUNTER — Encounter (HOSPITAL_BASED_OUTPATIENT_CLINIC_OR_DEPARTMENT_OTHER): Payer: Self-pay | Admitting: Orthopaedic Surgery

## 2023-03-01 ENCOUNTER — Encounter (HOSPITAL_BASED_OUTPATIENT_CLINIC_OR_DEPARTMENT_OTHER)
Admission: RE | Disposition: A | Discharge status: Home / Self Care | Payer: Self-pay | Source: Home / Self Care | Attending: Orthopaedic Surgery

## 2023-03-01 ENCOUNTER — Ambulatory Visit (HOSPITAL_BASED_OUTPATIENT_CLINIC_OR_DEPARTMENT_OTHER): Payer: 59

## 2023-03-01 ENCOUNTER — Other Ambulatory Visit: Payer: Self-pay

## 2023-03-01 DIAGNOSIS — M2021 Hallux rigidus, right foot: Secondary | ICD-10-CM | POA: Insufficient documentation

## 2023-03-01 DIAGNOSIS — D649 Anemia, unspecified: Secondary | ICD-10-CM

## 2023-03-01 DIAGNOSIS — M2011 Hallux valgus (acquired), right foot: Secondary | ICD-10-CM | POA: Diagnosis not present

## 2023-03-01 DIAGNOSIS — Z01818 Encounter for other preprocedural examination: Secondary | ICD-10-CM

## 2023-03-01 HISTORY — PX: CHEILECTOMY: SHX1336

## 2023-03-01 HISTORY — DX: Family history of other specified conditions: Z84.89

## 2023-03-01 HISTORY — DX: Nausea with vomiting, unspecified: Z98.890

## 2023-03-01 SURGERY — CHEILECTOMY
Anesthesia: General | Laterality: Right

## 2023-03-01 MED ORDER — MIDAZOLAM HCL 2 MG/2ML IJ SOLN
INTRAMUSCULAR | Status: AC
  Filled 2023-03-01: qty 2

## 2023-03-01 MED ORDER — FENTANYL CITRATE (PF) 100 MCG/2ML IJ SOLN
100.0000 ug | Freq: Once | INTRAMUSCULAR | Status: AC
  Administered 2023-03-01: 100 ug via INTRAVENOUS

## 2023-03-01 MED ORDER — DEXAMETHASONE SODIUM PHOSPHATE 10 MG/ML IJ SOLN
INTRAMUSCULAR | Status: DC | PRN
  Administered 2023-03-01: 4 mg via INTRAVENOUS

## 2023-03-01 MED ORDER — LIDOCAINE 2% (20 MG/ML) 5 ML SYRINGE
INTRAMUSCULAR | Status: AC
  Filled 2023-03-01: qty 5

## 2023-03-01 MED ORDER — PROPOFOL 500 MG/50ML IV EMUL
INTRAVENOUS | Status: DC | PRN
  Administered 2023-03-01: 170 ug/kg/min via INTRAVENOUS

## 2023-03-01 MED ORDER — CHLORHEXIDINE GLUCONATE 4 % EX SOLN: 60.0000 mL | Freq: Once | CUTANEOUS | Status: DC

## 2023-03-01 MED ORDER — CEFAZOLIN SODIUM-DEXTROSE 2-4 GM/100ML-% IV SOLN: 2.0000 g | INTRAVENOUS | Status: DC

## 2023-03-01 MED ORDER — ACETAMINOPHEN 500 MG PO TABS
ORAL_TABLET | ORAL | Status: AC
  Filled 2023-03-01: qty 2

## 2023-03-01 MED ORDER — PROPOFOL 10 MG/ML IV BOLUS
INTRAVENOUS | Status: AC
  Filled 2023-03-01: qty 20

## 2023-03-01 MED ORDER — CEFAZOLIN SODIUM-DEXTROSE 2-4 GM/100ML-% IV SOLN
INTRAVENOUS | Status: AC
  Filled 2023-03-01: qty 100

## 2023-03-01 MED ORDER — ACETAMINOPHEN 500 MG PO TABS
1000.0000 mg | ORAL_TABLET | Freq: Once | ORAL | Status: AC
  Administered 2023-03-01: 1000 mg via ORAL

## 2023-03-01 MED ORDER — MIDAZOLAM HCL 2 MG/2ML IJ SOLN
2.0000 mg | Freq: Once | INTRAMUSCULAR | Status: AC
  Administered 2023-03-01: 2 mg via INTRAVENOUS

## 2023-03-01 MED ORDER — LACTATED RINGERS IV SOLN: INTRAVENOUS | Status: DC

## 2023-03-01 MED ORDER — BUPIVACAINE-EPINEPHRINE (PF) 0.5% -1:200000 IJ SOLN
INTRAMUSCULAR | Status: DC | PRN
  Administered 2023-03-01: 30 mL via PERINEURAL

## 2023-03-01 MED ORDER — HYDROMORPHONE HCL 1 MG/ML IJ SOLN: 0.2500 mg | INTRAMUSCULAR | Status: DC | PRN

## 2023-03-01 MED ORDER — PROPOFOL 10 MG/ML IV BOLUS
INTRAVENOUS | Status: DC | PRN
  Administered 2023-03-01: 150 mg via INTRAVENOUS

## 2023-03-01 MED ORDER — 0.9 % SODIUM CHLORIDE (POUR BTL) OPTIME
TOPICAL | Status: DC | PRN
  Administered 2023-03-01: 200 mL

## 2023-03-01 MED ORDER — FENTANYL CITRATE (PF) 100 MCG/2ML IJ SOLN
INTRAMUSCULAR | Status: AC
  Filled 2023-03-01: qty 2

## 2023-03-01 MED ORDER — LIDOCAINE HCL (CARDIAC) PF 100 MG/5ML IV SOSY
PREFILLED_SYRINGE | INTRAVENOUS | Status: DC | PRN
  Administered 2023-03-01: 30 mg via INTRAVENOUS

## 2023-03-01 MED ORDER — ONDANSETRON HCL 4 MG/2ML IJ SOLN
INTRAMUSCULAR | Status: DC | PRN
  Administered 2023-03-01: 4 mg via INTRAVENOUS

## 2023-03-01 MED ORDER — VANCOMYCIN HCL 500 MG IV SOLR
INTRAVENOUS | Status: DC | PRN
  Administered 2023-03-01: 500 mg

## 2023-03-01 SURGICAL SUPPLY — 63 items
APL PRP STRL LF DISP 70% ISPRP (MISCELLANEOUS) ×2
BANDAGE ESMARK 6X9 LF (GAUZE/BANDAGES/DRESSINGS) ×1 IMPLANT
BLADE AVERAGE 25X9 (BLADE) IMPLANT
BLADE SURG 15 STRL LF DISP TIS (BLADE) ×4 IMPLANT
BLADE SURG 15 STRL SS (BLADE) ×4
BNDG CMPR 5X4 CHSV STRCH STRL (GAUZE/BANDAGES/DRESSINGS) ×1
BNDG CMPR 5X4 KNIT ELC UNQ LF (GAUZE/BANDAGES/DRESSINGS) ×1
BNDG CMPR 9X6 STRL LF SNTH (GAUZE/BANDAGES/DRESSINGS) ×1
BNDG COHESIVE 4X5 TAN STRL LF (GAUZE/BANDAGES/DRESSINGS) ×1 IMPLANT
BNDG ELASTIC 4INX 5YD STR LF (GAUZE/BANDAGES/DRESSINGS) ×1 IMPLANT
BNDG ESMARK 6X9 LF (GAUZE/BANDAGES/DRESSINGS) ×1
BRUSH SCRUB EZ  4% CHG (MISCELLANEOUS) ×1
BRUSH SCRUB EZ 4% CHG (MISCELLANEOUS) ×1 IMPLANT
CANISTER SUCT 1200ML W/VALVE (MISCELLANEOUS) ×1 IMPLANT
CHLORAPREP W/TINT 26 (MISCELLANEOUS) ×2 IMPLANT
COVER BACK TABLE 60X90IN (DRAPES) ×1 IMPLANT
CUFF TOURN SGL QUICK 34 (TOURNIQUET CUFF)
CUFF TRNQT CYL 34X4.125X (TOURNIQUET CUFF) IMPLANT
DRAPE EXTREMITY T 121X128X90 (DISPOSABLE) ×1 IMPLANT
DRAPE IMP U-DRAPE 54X76 (DRAPES) ×1 IMPLANT
DRAPE U-SHAPE 47X51 STRL (DRAPES) ×1 IMPLANT
ELECT REM PT RETURN 9FT ADLT (ELECTROSURGICAL) ×1
ELECTRODE REM PT RTRN 9FT ADLT (ELECTROSURGICAL) ×1 IMPLANT
GAUZE PAD ABD 8X10 STRL (GAUZE/BANDAGES/DRESSINGS) ×1 IMPLANT
GAUZE SPONGE 4X4 12PLY STRL (GAUZE/BANDAGES/DRESSINGS) ×1 IMPLANT
GAUZE XEROFORM 1X8 LF (GAUZE/BANDAGES/DRESSINGS) ×1 IMPLANT
GLOVE BIOGEL PI IND STRL 8 (GLOVE) ×1 IMPLANT
GLOVE SURG SS PI 7.5 STRL IVOR (GLOVE) ×2 IMPLANT
GOWN STRL REUS W/ TWL LRG LVL3 (GOWN DISPOSABLE) ×2 IMPLANT
GOWN STRL REUS W/TWL LRG LVL3 (GOWN DISPOSABLE) ×2
MARKER SKIN DUAL TIP RULER LAB (MISCELLANEOUS) IMPLANT
NDL HYPO 22X1.5 SAFETY MO (MISCELLANEOUS) IMPLANT
NEEDLE HYPO 22X1.5 SAFETY MO (MISCELLANEOUS) IMPLANT
NS IRRIG 1000ML POUR BTL (IV SOLUTION) ×1 IMPLANT
PACK BASIN DAY SURGERY FS (CUSTOM PROCEDURE TRAY) ×1 IMPLANT
PAD CAST 4YDX4 CTTN HI CHSV (CAST SUPPLIES) ×1 IMPLANT
PADDING CAST ABS COTTON 4X4 ST (CAST SUPPLIES) IMPLANT
PADDING CAST COTTON 4X4 STRL (CAST SUPPLIES) ×1
PADDING CAST SYNTHETIC 4X4 STR (CAST SUPPLIES) ×4 IMPLANT
PENCIL SMOKE EVACUATOR (MISCELLANEOUS) ×1 IMPLANT
SANITIZER HAND PURELL FF 515ML (MISCELLANEOUS) ×1 IMPLANT
SET IRRIG Y TYPE TUR BLADDER L (SET/KITS/TRAYS/PACK) IMPLANT
SHEET MEDIUM DRAPE 40X70 STRL (DRAPES) ×1 IMPLANT
SLEEVE SCD COMPRESS KNEE MED (STOCKING) ×1 IMPLANT
SPIKE FLUID TRANSFER (MISCELLANEOUS) IMPLANT
SPONGE T-LAP 18X18 ~~LOC~~+RFID (SPONGE) ×1 IMPLANT
STOCKINETTE 6  STRL (DRAPES) ×1
STOCKINETTE 6 STRL (DRAPES) ×1 IMPLANT
STOCKINETTE ORTHO 6X25 (MISCELLANEOUS) ×1 IMPLANT
SUCTION FRAZIER HANDLE 10FR (MISCELLANEOUS)
SUCTION TUBE FRAZIER 10FR DISP (MISCELLANEOUS) IMPLANT
SUT ETHILON 2 0 FS 18 (SUTURE) ×2 IMPLANT
SUT MNCRL AB 3-0 PS2 18 (SUTURE) ×1 IMPLANT
SUT VIC AB 2-0 SH 27 (SUTURE) ×1
SUT VIC AB 2-0 SH 27XBRD (SUTURE) ×1 IMPLANT
SUT VIC AB 3-0 SH 27 (SUTURE)
SUT VIC AB 3-0 SH 27X BRD (SUTURE) IMPLANT
SUT VICRYL 0 SH 27 (SUTURE) IMPLANT
SYR BULB EAR ULCER 3OZ GRN STR (SYRINGE) ×1 IMPLANT
SYR CONTROL 10ML LL (SYRINGE) IMPLANT
TOWEL GREEN STERILE FF (TOWEL DISPOSABLE) ×2 IMPLANT
TUBE CONNECTING 20X1/4 (TUBING) ×1 IMPLANT
UNDERPAD 30X36 HEAVY ABSORB (UNDERPADS AND DIAPERS) ×1 IMPLANT

## 2023-03-01 NOTE — H&P (Signed)
H&P Update:  -History and Physical Reviewed  -Patient has been re-examined  -No change in the plan of care  -The risks and benefits were presented and reviewed. The risks due to progression of arthritis requiring further surgery including fusion, any hardware/suture failure and/or irritation, new/persistent infection, stiffness, nerve/vessel/tendon injury or rerupture of repaired tendon, nonunion/malunion, allograft usage, wound healing issues, development of arthritis, failure of this surgery, possibility of external fixation with delayed definitive surgery, need for further surgery, thromboembolic events, anesthesia/medical complications, amputation, death among others were discussed. The patient acknowledged the explanation, agreed to proceed with the plan and a consent was signed.  Kathryn Sandoval

## 2023-03-01 NOTE — Transfer of Care (Signed)
Immediate Anesthesia Transfer of Care Note  Patient: Kathryn Sandoval  Procedure(s) Performed: RIGHT 1ST METATARSALPHALANGEAL DORSAL CHEILECTOMY (Right)  Patient Location: PACU  Anesthesia Type:GA combined with regional for post-op pain  Level of Consciousness: drowsy and patient cooperative  Airway & Oxygen Therapy: Patient Spontanous Breathing and Patient connected to face mask oxygen  Post-op Assessment: Report given to RN and Post -op Vital signs reviewed and stable  Post vital signs: Reviewed and stable  Last Vitals:  Vitals Value Taken Time  BP    Temp    Pulse 70 03/01/23 0923  Resp    SpO2 98 % 03/01/23 0923  Vitals shown include unvalidated device data.  Last Pain:  Vitals:   03/01/23 0657  TempSrc: Oral  PainSc: 0-No pain         Complications: No notable events documented.

## 2023-03-01 NOTE — Anesthesia Procedure Notes (Signed)
Anesthesia Regional Block: Popliteal block   Pre-Anesthetic Checklist: , timeout performed,  Correct Patient, Correct Site, Correct Laterality,  Correct Procedure, Correct Position, site marked,  Risks and benefits discussed,  Pre-op evaluation,  At surgeon's request and post-op pain management  Laterality: Right  Prep: Maximum Sterile Barrier Precautions used, chloraprep       Needles:  Injection technique: Single-shot  Needle Type: Echogenic Stimulator Needle     Needle Length: 9cm  Needle Gauge: 21     Additional Needles:   Procedures:,,,, ultrasound used (permanent image in chart),,    Narrative:  Start time: 03/01/2023 7:50 AM End time: 03/01/2023 8:00 AM Injection made incrementally with aspirations every 5 mL. Anesthesiologist: Gaynelle Adu, MD

## 2023-03-01 NOTE — Op Note (Addendum)
03/01/2023  9:31 AM   PATIENT: Kathryn Sandoval  52 y.o. female  MRN: [id number]   PRE-OPERATIVE DIAGNOSIS:   Acquired right hallux rigidus   POST-OPERATIVE DIAGNOSIS:   Acquired right hallux rigidus   PROCEDURE: 1] Right 1st MTP dorsal cheilectomy 2] Right 1st MTP joint arthrotomy with debridement of loose chondral flap and excision of loose body    SURGEON:  Netta Cedars, MD   ASSISTANT: None   ANESTHESIA: General, regional   EBL: Minimal   TOURNIQUET:    Total Tourniquet Time Documented: Thigh (Right) - 30 minutes Total: Thigh (Right) - 30 minutes    COMPLICATIONS: None apparent   DISPOSITION: Extubated, awake and stable to recovery.   INDICATION FOR PROCEDURE: The patient presented with above diagnosis. She is an avid runner who notes pain only with max dorsiflexion of the right 1st MTP. She is otherwise asymptomatic at rest.  We discussed the diagnosis, alternative treatment options, risks and benefits of the above surgical intervention, as well as alternative non-operative treatments. All questions/concerns were addressed and the patient/family demonstrated appropriate understanding of the diagnosis, the procedure, the postoperative course, and overall prognosis. The patient wished to proceed with surgical intervention and signed an informed surgical consent as such, in each others presence prior to surgery.   PROCEDURE IN DETAIL: After preoperative consent was obtained and the correct operative site was identified, the patient was brought to the operating room supine on stretcher and transferred onto operating table. General anesthesia was induced. Preoperative antibiotics were administered. Surgical timeout was taken. The patient was then positioned supine with an ipsilateral hip bump. The operative lower extremity was prepped and draped in standard sterile fashion with a tourniquet around the thigh. The extremity was exsanguinated and the tourniquet  was inflated to 275 mmHg.  A dorsal approach was made directly over the first metatarsophalangeal joint. This was carried down to the level of the extensor hallucis longus tendon. The capsule was incised medial to the tendon, and the tendon was protected throughout the procedure. The dorsal osteophytes over the metatarsal head and the proximal phalanx along the articular surfaces were resected with a saw blade and rongeurs. On inspection of the 1st MTP joint, there was noted to be a minute chondral flap over the 1st metatarsal head in the superolateral quadrant. This was debrided to remove any unstable cartilage flap.This was accompanied by excision of cartilaginous loose body. The remainder of the joint appeared healthy.    Intraoperative fluoroscopy was utilized to confirm adequate gain in 1st MTP dorsiflexion.  The surgical sites were thoroughly irrigated. The tourniquet was deflated and hemostasis achieved. Betadine and vancomycin powder applied. Bone wax applied to the resected bone bed. The deep layers were closed using 2-0 vicryl. The skin was closed without tension using 2-0 nylon suture.    The leg was cleaned with saline and sterile dressings with gauze were applied. A well padded loose compressive wrap was applied. The patient was awakened from anesthesia and transported to the recovery room in stable condition.    FOLLOW UP PLAN: -transfer to PACU, then home -strict NWB operative extremity until block wears off and then heel WB in postop shoe, maximum elevation -maintain dressings until follow up -DVT ppx: Aspirin 81 mg twice daily while NWB -follow up as outpatient on May 21st at 8 AM for wound check -sutures out in 2-3 weeks in outpatient office   RADIOGRAPHS: AP, lateral, oblique and stress radiographs of the right foot were obtained intraoperatively. These showed  interval gain in dorsiflexion of the 1st MTP with resection of osteophytes. Manual stress radiographs were taken and  the MTP joint was noted to be stable following resection. No other acute injuries are noted.   Netta Cedars [facility]

## 2023-03-01 NOTE — Anesthesia Postprocedure Evaluation (Signed)
Anesthesia Post Note  Patient: Kymberley D Zehner  Procedure(s) Performed: RIGHT 1ST METATARSALPHALANGEAL DORSAL CHEILECTOMY (Right)     Patient location during evaluation: PACU Anesthesia Type: General and Regional Level of consciousness: awake and alert Pain management: pain level controlled Vital Signs Assessment: post-procedure vital signs reviewed and stable Respiratory status: spontaneous breathing, nonlabored ventilation and respiratory function stable Cardiovascular status: blood pressure returned to baseline and stable Postop Assessment: no apparent nausea or vomiting Anesthetic complications: no  No notable events documented.  Last Vitals:  Vitals:   03/01/23 1000 03/01/23 1012  BP: 100/68 107/71  Pulse: (!) 57 (!) 56  Resp: 14 18  Temp:  (!) 36.2 C  SpO2: 99% 98%    Last Pain:  Vitals:   03/01/23 1012  TempSrc:   PainSc: 0-No pain                 Alaiah Lundy,W. EDMOND

## 2023-03-01 NOTE — Anesthesia Procedure Notes (Signed)
Procedure Name: LMA Insertion Date/Time: 03/01/2023 8:36 AM  Performed by: Mackay Hanauer, Jewel Baize, CRNAPre-anesthesia Checklist: Patient identified, Emergency Drugs available, Suction available and Patient being monitored Patient Re-evaluated:Patient Re-evaluated prior to induction Oxygen Delivery Method: Circle system utilized Preoxygenation: Pre-oxygenation with 100% oxygen Induction Type: IV induction Ventilation: Mask ventilation without difficulty LMA: LMA inserted LMA Size: 4.0 Number of attempts: 1 Airway Equipment and Method: Bite block Placement Confirmation: positive ETCO2 Tube secured with: Tape Dental Injury: Teeth and Oropharynx as per pre-operative assessment

## 2023-03-01 NOTE — Progress Notes (Signed)
Assisted Dr. Edmond Fitzgerald with right, popliteal, ultrasound guided block. Side rails up, monitors on throughout procedure. See vital signs in flow sheet. Tolerated Procedure well. 

## 2023-03-02 ENCOUNTER — Encounter (HOSPITAL_BASED_OUTPATIENT_CLINIC_OR_DEPARTMENT_OTHER): Payer: Self-pay | Admitting: Orthopaedic Surgery

## 2023-03-10 ENCOUNTER — Ambulatory Visit
Admission: RE | Admit: 2023-03-10 | Discharge: 2023-03-10 | Disposition: A | Discharge status: Home / Self Care | Payer: 59 | Source: Ambulatory Visit | Attending: Obstetrics and Gynecology | Admitting: Obstetrics and Gynecology

## 2023-03-10 DIAGNOSIS — Z1231 Encounter for screening mammogram for malignant neoplasm of breast: Secondary | ICD-10-CM

## 2023-07-17 IMAGING — MG MM DIGITAL SCREENING BILAT W/ TOMO AND CAD
8 series · 8 of 24 positions shown · non-contrast
Comparison: Previous exam(s).

CLINICAL DATA: Screening.

EXAM:
DIGITAL SCREENING BILATERAL MAMMOGRAM WITH TOMOSYNTHESIS AND CAD
TECHNIQUE: Bilateral screening digital craniocaudal and mediolateral oblique
mammograms were obtained. Bilateral screening digital breast
tomosynthesis was performed. The images were evaluated with
computer-aided detection.

[L MLO synth-2D]
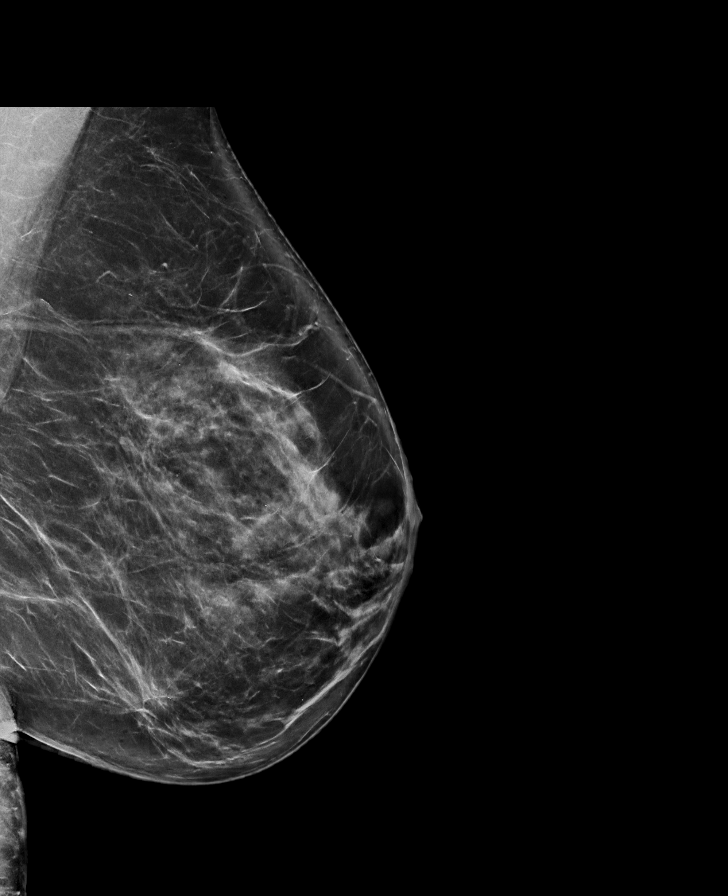

[R MLO synth-2D]
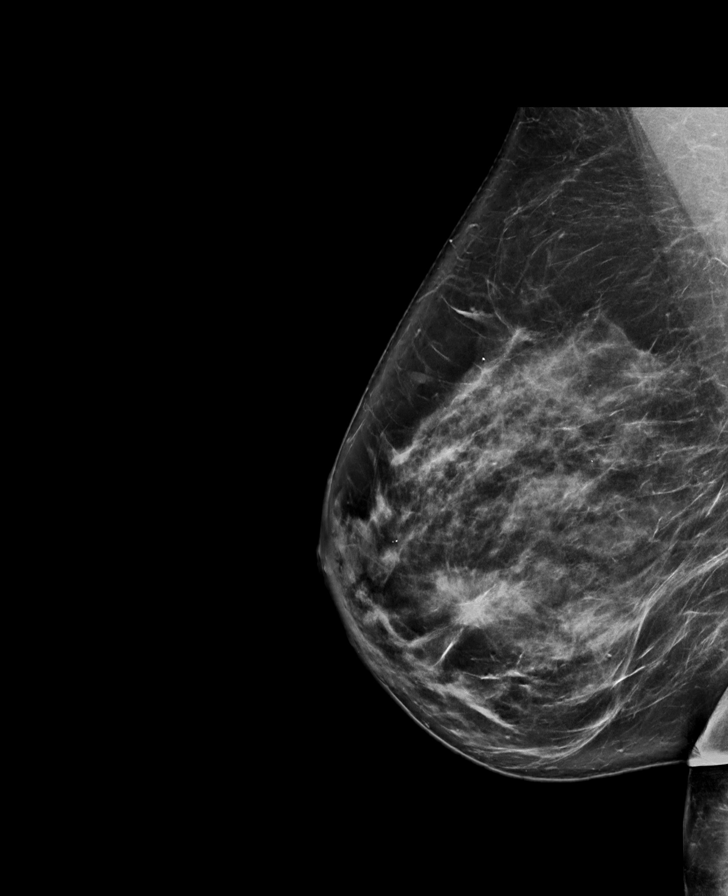

[L CC synth-2D]
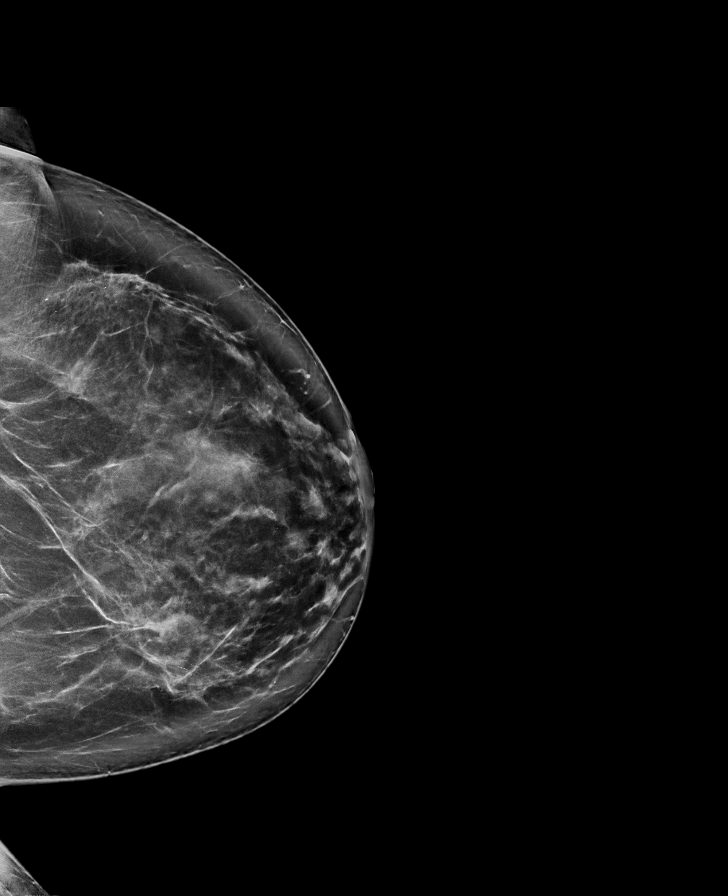

[R CC synth-2D]
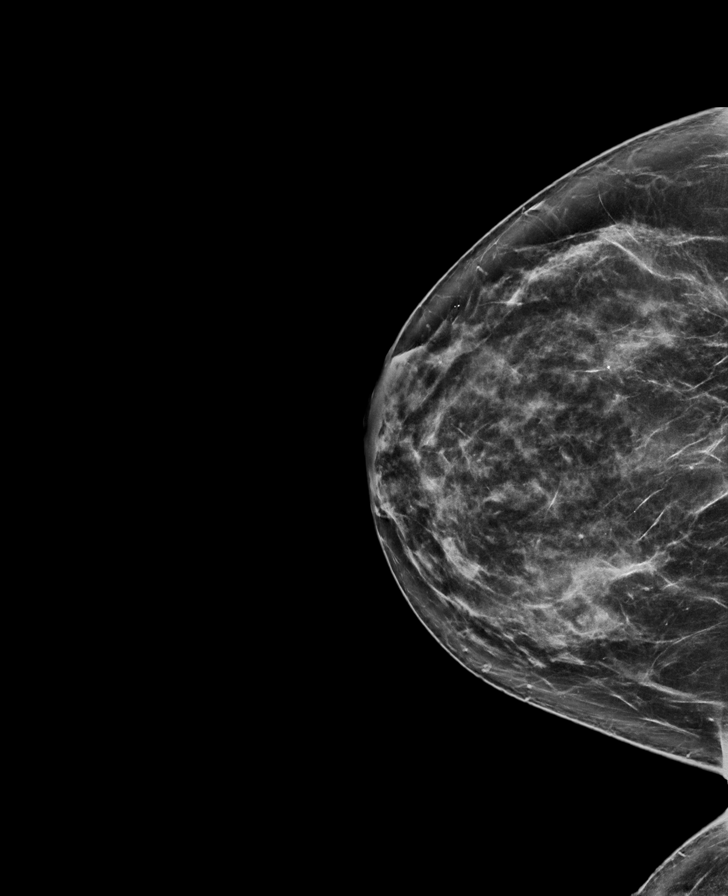

[L MLO tomo · tomo slice 47/94.0]
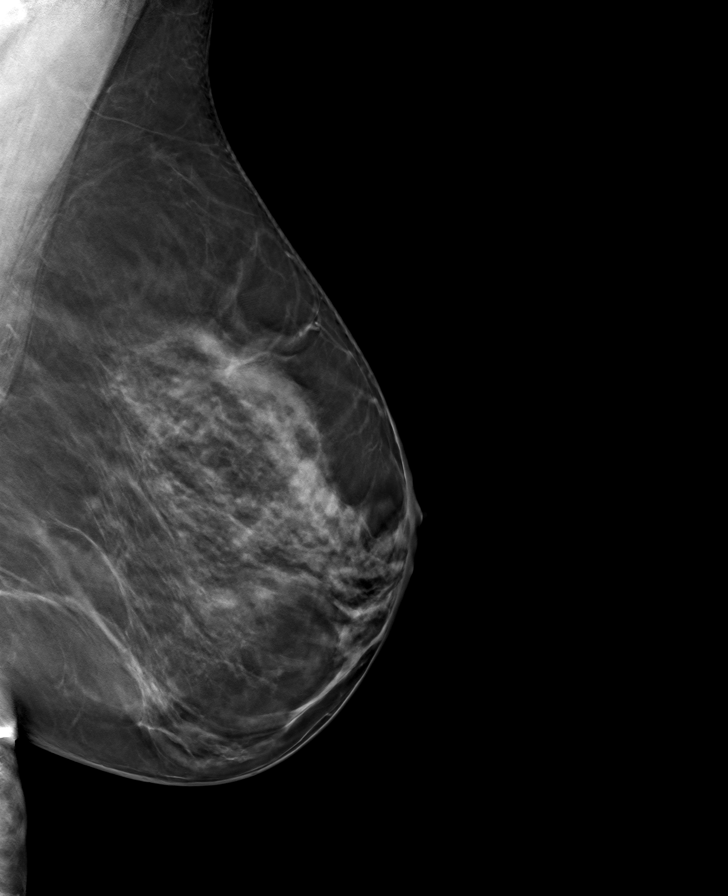

[R MLO tomo · tomo slice 47/93.0]
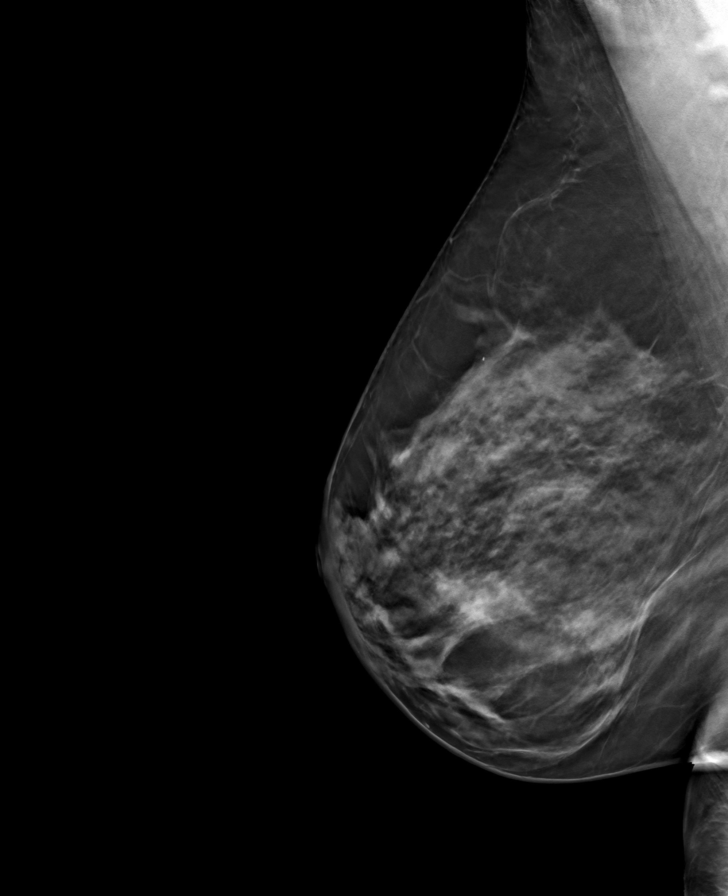

[R CC tomo · tomo slice 43/86.0]
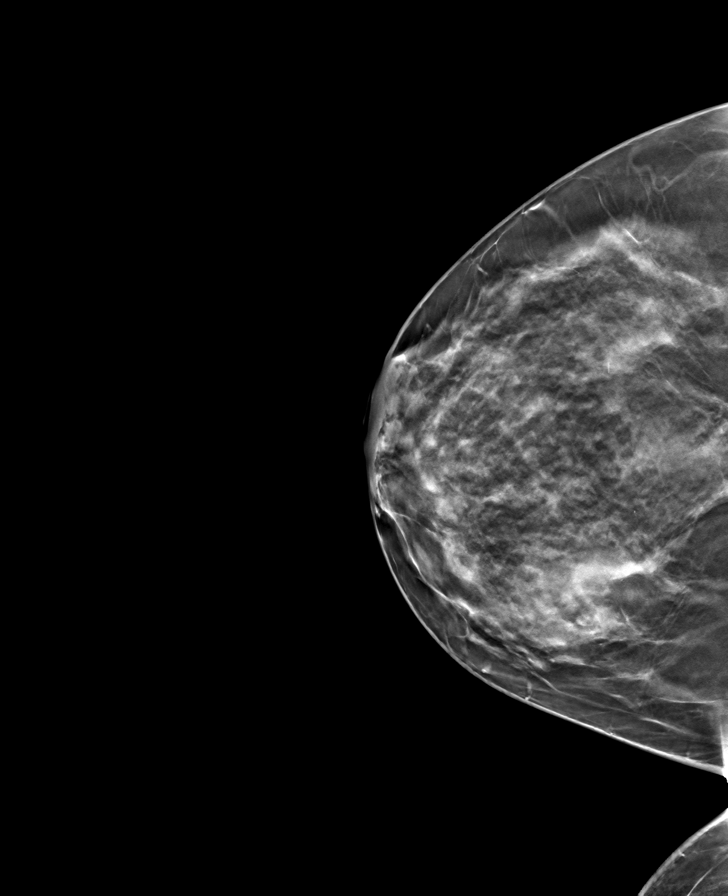

[L CC tomo · tomo slice 51/102.0]
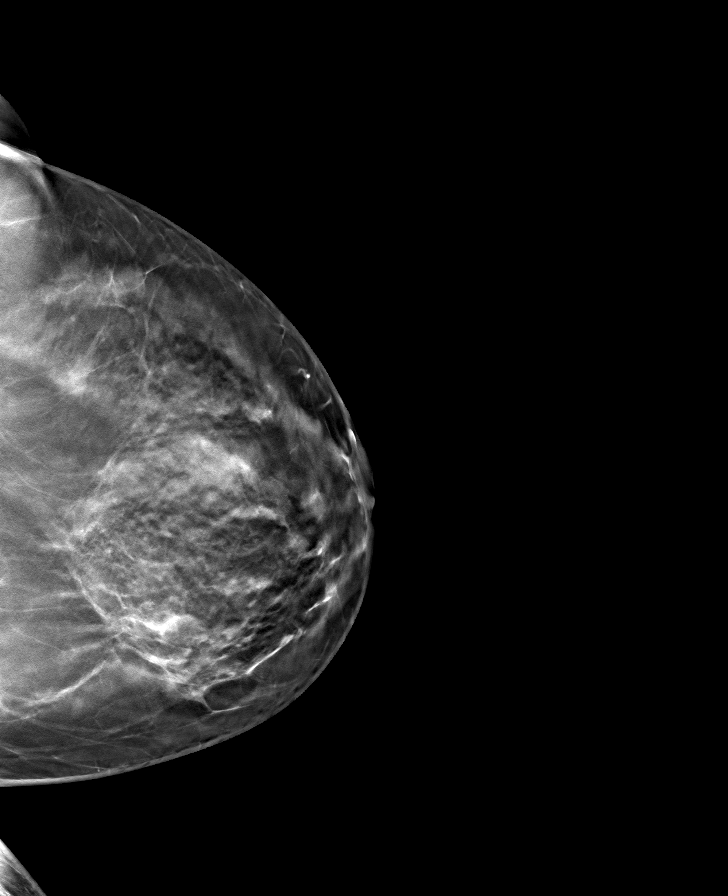

[8 of 24 positions shown; findings below may reference images not displayed]

ACR Breast Density Category c: The breast tissue is heterogeneously
dense, which may obscure small masses.
FINDINGS: There are no findings suspicious for malignancy.
IMPRESSION: No mammographic evidence of malignancy. A result letter of this
screening mammogram will be mailed directly to the patient.

RECOMMENDATION:
Screening mammogram in one year. (Code:Q3-W-BC3)

BI-RADS CATEGORY  1: Negative.

## 2024-01-24 ENCOUNTER — Other Ambulatory Visit: Payer: Self-pay | Admitting: Obstetrics and Gynecology

## 2024-01-24 DIAGNOSIS — Z0000 Encounter for general adult medical examination without abnormal findings: Secondary | ICD-10-CM

## 2024-03-12 ENCOUNTER — Ambulatory Visit

## 2024-04-08 ENCOUNTER — Ambulatory Visit
Admission: RE | Admit: 2024-04-08 | Discharge: 2024-04-08 | Disposition: A | Discharge status: Home / Self Care | Source: Ambulatory Visit | Attending: Obstetrics and Gynecology | Admitting: Obstetrics and Gynecology

## 2024-04-08 DIAGNOSIS — Z0000 Encounter for general adult medical examination without abnormal findings: Secondary | ICD-10-CM
